# Patient Record
Sex: Male | Born: 1960 | Race: Black or African American | Hispanic: No | Marital: Single | Smoking: Never smoker
Health system: Southern US, Community
[De-identification: ages and names within clinical notes are randomized; demographics above are authoritative.]

## PROBLEM LIST (undated history)

## (undated) DIAGNOSIS — I10 Essential (primary) hypertension: Secondary | ICD-10-CM

## (undated) DIAGNOSIS — E785 Hyperlipidemia, unspecified: Secondary | ICD-10-CM

## (undated) DIAGNOSIS — R079 Chest pain, unspecified: Secondary | ICD-10-CM

## (undated) HISTORY — PX: BUNIONECTOMY: SHX129

## (undated) HISTORY — DX: Chest pain, unspecified: R07.9

---

## 1998-02-04 ENCOUNTER — Encounter: Admission: RE | Admit: 1998-02-04 | Discharge: 1998-02-04 | Payer: Self-pay | Admitting: *Deleted

## 1999-01-27 ENCOUNTER — Ambulatory Visit (HOSPITAL_COMMUNITY): Admission: RE | Admit: 1999-01-27 | Discharge: 1999-01-27 | Payer: Self-pay | Admitting: Gastroenterology

## 1999-09-03 ENCOUNTER — Emergency Department (HOSPITAL_COMMUNITY): Admission: EM | Admit: 1999-09-03 | Discharge: 1999-09-03 | Payer: Self-pay | Admitting: Emergency Medicine

## 2000-01-09 ENCOUNTER — Emergency Department (HOSPITAL_COMMUNITY): Admission: EM | Admit: 2000-01-09 | Discharge: 2000-01-09 | Payer: Self-pay | Admitting: Emergency Medicine

## 2008-08-07 ENCOUNTER — Emergency Department (HOSPITAL_COMMUNITY): Admission: EM | Admit: 2008-08-07 | Discharge: 2008-08-07 | Payer: Self-pay | Admitting: Emergency Medicine

## 2009-07-16 ENCOUNTER — Emergency Department (HOSPITAL_COMMUNITY): Admission: EM | Admit: 2009-07-16 | Discharge: 2009-07-16 | Payer: Self-pay | Admitting: Emergency Medicine

## 2011-01-15 ENCOUNTER — Encounter: Payer: Self-pay | Admitting: Emergency Medicine

## 2011-01-15 ENCOUNTER — Emergency Department (HOSPITAL_COMMUNITY)
Admission: EM | Admit: 2011-01-15 | Discharge: 2011-01-15 | Disposition: A | Discharge status: Home / Self Care | Payer: Self-pay | Attending: Emergency Medicine | Admitting: Emergency Medicine

## 2011-01-15 ENCOUNTER — Emergency Department (HOSPITAL_COMMUNITY): Payer: Self-pay

## 2011-01-15 DIAGNOSIS — R1032 Left lower quadrant pain: Secondary | ICD-10-CM | POA: Insufficient documentation

## 2011-01-15 DIAGNOSIS — Z87891 Personal history of nicotine dependence: Secondary | ICD-10-CM | POA: Insufficient documentation

## 2011-01-15 HISTORY — DX: Hyperlipidemia, unspecified: E78.5

## 2011-01-15 HISTORY — DX: Essential (primary) hypertension: I10

## 2011-01-15 LAB — URINALYSIS, ROUTINE W REFLEX MICROSCOPIC
Bilirubin Urine: NEGATIVE
Ketones, ur: NEGATIVE mg/dL
Leukocytes, UA: NEGATIVE
Protein, ur: NEGATIVE mg/dL
Urobilinogen, UA: 0.2 mg/dL (ref 0.0–1.0)

## 2011-01-15 LAB — DIFFERENTIAL
Eosinophils Relative: 1 % (ref 0–5)
Monocytes Absolute: 0.6 10*3/uL (ref 0.1–1.0)
Neutro Abs: 1.9 10*3/uL (ref 1.7–7.7)
Neutrophils Relative %: 45 % (ref 43–77)

## 2011-01-15 LAB — CBC
Hemoglobin: 12.5 g/dL — ABNORMAL LOW (ref 13.0–17.0)
MCH: 25.1 pg — ABNORMAL LOW (ref 26.0–34.0)
Platelets: 135 10*3/uL — ABNORMAL LOW (ref 150–400)
RBC: 4.98 MIL/uL (ref 4.22–5.81)
RDW: 15.1 % (ref 11.5–15.5)
WBC: 4.3 10*3/uL (ref 4.0–10.5)

## 2011-01-15 LAB — BASIC METABOLIC PANEL
Calcium: 9.6 mg/dL (ref 8.4–10.5)
Chloride: 100 mEq/L (ref 96–112)
GFR calc Af Amer: >60 mL/min (ref >60)
Potassium: 4.1 mEq/L (ref 3.5–5.1)

## 2011-01-15 MED ORDER — IOHEXOL 300 MG/ML  SOLN
100.0000 mL | Freq: Once | INTRAMUSCULAR | Status: AC | PRN
  Administered 2011-01-15: 100 mL via INTRAVENOUS

## 2011-01-15 NOTE — ED Provider Notes (Addendum)
Scribed for Dr. Ethelda Chick, the patient was seen in room 11. This chart was scribed by Jannette Fogo. This patient's care was started at 09:40.    Chief Complaint  Patient presents with  . Abdominal Pain    HPI Isaac Lopez is a 50 y.o. male who presents to the Emergency Department complaining of 5 days of intermittent LLQ abdominal pain. Patient states he developed "sharp" abdominal pain on Tuesday 01/11/11 at 04:00AM which woke him up from sleep. At that time the pain was sharp, a "6"/10 in severity, and he had associated increased urination. Since the pain "comes and goes" but currently it is mild and described as a "pulling" sensation. Reports pain exacerbation with coughing and sneezing but it is not relieved by anything. Patient denies any associated nausea, vomiting, diarrhea, or urinary discomfort. Last bowel movement was yesterday and normal. Patient has a history of diverticulitis "many years ago" but reports current pain feels different. There are no other associated symptoms and no other alleviating or aggravating factors.     Past Medical History  Diagnosis Date  . Hypertension   . Hyperlipemia   Heart murmur diagnosed in 2006.  Diverticulitis "many years ago  Past Surgical History  Procedure Date  . Bunionectomy    MEDICATIONS:  Previous Medications   CYANOCOBALAMIN (VITAMIN B-12) 2500 MCG SUBL    Place 1 tablet under the tongue daily.       ALLERGIES:  Allergies as of 01/15/2011 - Review Complete 01/15/2011  Allergen Reaction Noted  . Other  01/15/2011     Family History  Problem Relation Age of Onset  . Diabetes Mother   . Cancer Father   . Thyroid disease Sister   . Thyroid disease Other     History  Substance Use Topics  . Smoking status: Former Games developer  . Smokeless tobacco: Never Used  . Alcohol Use: No     Review of Systems  Constitutional: Negative.   HENT: Negative.   Respiratory: Negative.   Cardiovascular: Negative.   Gastrointestinal:  Positive for abdominal pain. Negative for nausea, vomiting and diarrhea.  Musculoskeletal: Negative.   Skin: Negative.   Neurological: Negative.   Hematological: Negative.   Psychiatric/Behavioral: Negative.     Physical Exam  BP 126/83  Pulse 69  Temp(Src) 98.1 F (36.7 C) (Oral)  Resp 18  Ht 5\' 10"  (1.778 m)  Wt 218 lb 14.4 oz (99.292 kg)  BMI 31.41 kg/m2  SpO2 94%  Physical Exam  Constitutional: He is oriented to person, place, and time. He appears well-developed and well-nourished. No distress.  HENT:  Head: Normocephalic and atraumatic.  Mouth/Throat: Oropharynx is clear and moist.  Eyes: Conjunctivae are normal. Pupils are equal, round, and reactive to light.  Neck: Neck supple. Carotid bruit is not present. No tracheal deviation (trachea midline) present. No thyromegaly present.  Cardiovascular: Normal rate, regular rhythm and normal heart sounds.   No murmur heard. Pulmonary/Chest: Effort normal and breath sounds normal.  Abdominal: Bowel sounds are normal. He exhibits no distension. There is tenderness (LLQ). There is no guarding.  Genitourinary: Penis normal.       Genitalia normal male  Musculoskeletal: Normal range of motion. He exhibits no edema and no tenderness.  Neurological: He is alert and oriented to person, place, and time. Coordination normal.  Skin: Skin is warm and dry.  Psychiatric: He has a normal mood and affect.    OTHER DATA REVIEWED: Nursing notes, vital signs, and past medical records reviewed.   DIAGNOSTIC  STUDIES: Oxygen Saturation is 94% on room air, normal by my interpretation.     LABS / RADIOLOGY:  Results for orders placed during the hospital encounter of 01/15/11  BASIC METABOLIC PANEL      Component Value Range   Sodium 138  135 - 145 (mEq/L)   Potassium 4.1  3.5 - 5.1 (mEq/L)   Chloride 100  96 - 112 (mEq/L)   CO2 29  19 - 32 (mEq/L)   Glucose, Bld 98  70 - 99 (mg/dL)   BUN 12  6 - 23 (mg/dL)   Creatinine, Ser 1.61  0.50  - 1.35 (mg/dL)   Calcium 9.6  8.4 - 09.6 (mg/dL)   GFR calc non Af Amer >60  >60 (mL/min)   GFR calc Af Amer >60  >60 (mL/min)  URINALYSIS, ROUTINE W REFLEX MICROSCOPIC      Component Value Range   Color, Urine YELLOW  YELLOW    Appearance CLEAR  CLEAR    Specific Gravity, Urine 1.010  1.005 - 1.030    pH 6.0  5.0 - 8.0    Glucose, UA NEGATIVE  NEGATIVE (mg/dL)   Hgb urine dipstick NEGATIVE  NEGATIVE    Bilirubin Urine NEGATIVE  NEGATIVE    Ketones, ur NEGATIVE  NEGATIVE (mg/dL)   Protein, ur NEGATIVE  NEGATIVE (mg/dL)   Urobilinogen, UA 0.2  0.0 - 1.0 (mg/dL)   Nitrite NEGATIVE  NEGATIVE    Leukocytes, UA NEGATIVE  NEGATIVE   CBC      Component Value Range   WBC 4.3  4.0 - 10.5 (K/uL)   RBC 4.98  4.22 - 5.81 (MIL/uL)   Hemoglobin 12.5 (*) 13.0 - 17.0 (g/dL)   HCT 04.5 (*) 40.9 - 52.0 (%)   MCV 77.9 (*) 78.0 - 100.0 (fL)   MCH 25.1 (*) 26.0 - 34.0 (pg)   MCHC 32.2  30.0 - 36.0 (g/dL)   RDW 81.1  91.4 - 78.2 (%)   Platelets 135 (*) 150 - 400 (K/uL)  DIFFERENTIAL      Component Value Range   Neutrophils Relative 45  43 - 77 (%)   Neutro Abs 1.9  1.7 - 7.7 (K/uL)   Lymphocytes Relative 41  12 - 46 (%)   Lymphs Abs 1.8  0.7 - 4.0 (K/uL)   Monocytes Relative 13 (*) 3 - 12 (%)   Monocytes Absolute 0.6  0.1 - 1.0 (K/uL)   Eosinophils Relative 1  0 - 5 (%)   Eosinophils Absolute 0.0  0.0 - 0.7 (K/uL)   Basophils Relative 0  0 - 1 (%)   Basophils Absolute 0.0  0.0 - 0.1 (K/uL)    CT Abdomen / Pelvis: Interpreted by Radiologist Dr.TAYLOR H. STROUD 1. No acute findings identified within the abdomen or pelvis. 2. No evidence for diverticulitis   ED COURSE / COORDINATION OF CARE: 14:00 - Re-examined by ED physician, patient states his abdomen is slight sore but he is comfortable. He declines pain medication at this time.    MDM: Pain felt to be nonspecific. No signs of peritonitis   IMPRESSION: 1. Nonspecific abdominal pain   PLAN: Discharge  Use Tylenol for pain and  follow up with PCP as needed.    CONDITION ON DISCHARGE: Improved and stable.    MEDICATIONS GIVEN IN THE E.D.  Medications  Cyanocobalamin (VITAMIN B-12) 2500 MCG SUBL (not administered)  iohexol (OMNIPAQUE) 300 MG/ML injection 100 mL (100 mL Intravenous Contrast Given 01/15/11 1209)     DISCHARGE MEDICATIONS:  New Prescriptions   No medications on file    Procedures    I personally performed the services described in this documentation, which was scribed in my presence. The recorded information has been reviewed and considered. No att. providers found     Doug Sou, MD 01/15/11 1407  Doug Sou, MD 01/15/11 1407  Doug Sou, MD 01/15/11 1409  Doug Sou, MD 02/12/11 2130  Doug Sou, MD 02/12/11 [id number]  Doug Sou, MD 02/27/11 2029

## 2011-01-15 NOTE — ED Notes (Signed)
Pt c/o L sided abd pain. More frequent urination during Tuesday night.

## 2013-12-12 ENCOUNTER — Emergency Department (HOSPITAL_COMMUNITY)
Admission: EM | Admit: 2013-12-12 | Discharge: 2013-12-12 | Disposition: A | Discharge status: Home / Self Care | Payer: 59 | Attending: Emergency Medicine | Admitting: Emergency Medicine

## 2013-12-12 ENCOUNTER — Emergency Department (HOSPITAL_COMMUNITY): Payer: 59

## 2013-12-12 ENCOUNTER — Encounter (HOSPITAL_COMMUNITY): Payer: Self-pay | Admitting: Emergency Medicine

## 2013-12-12 DIAGNOSIS — R109 Unspecified abdominal pain: Secondary | ICD-10-CM | POA: Insufficient documentation

## 2013-12-12 DIAGNOSIS — K6289 Other specified diseases of anus and rectum: Secondary | ICD-10-CM | POA: Insufficient documentation

## 2013-12-12 DIAGNOSIS — K644 Residual hemorrhoidal skin tags: Secondary | ICD-10-CM | POA: Insufficient documentation

## 2013-12-12 DIAGNOSIS — Z87891 Personal history of nicotine dependence: Secondary | ICD-10-CM | POA: Insufficient documentation

## 2013-12-12 DIAGNOSIS — I10 Essential (primary) hypertension: Secondary | ICD-10-CM | POA: Insufficient documentation

## 2013-12-12 LAB — URINALYSIS, ROUTINE W REFLEX MICROSCOPIC
BILIRUBIN URINE: NEGATIVE
GLUCOSE, UA: NEGATIVE mg/dL
HGB URINE DIPSTICK: NEGATIVE
KETONES UR: NEGATIVE mg/dL
Leukocytes, UA: NEGATIVE
Nitrite: NEGATIVE
PH: 7 (ref 5.0–8.0)
PROTEIN: NEGATIVE mg/dL
Specific Gravity, Urine: 1.009 (ref 1.005–1.030)
UROBILINOGEN UA: 1 mg/dL (ref 0.0–1.0)

## 2013-12-12 LAB — CBC WITH DIFFERENTIAL/PLATELET
BASOS PCT: 0 % (ref 0–1)
Basophils Absolute: 0 10*3/uL (ref 0.0–0.1)
Eosinophils Absolute: 0 10*3/uL (ref 0.0–0.7)
Eosinophils Relative: 1 % (ref 0–5)
HEMATOCRIT: 37.8 % — AB (ref 39.0–52.0)
Hemoglobin: 12 g/dL — ABNORMAL LOW (ref 13.0–17.0)
LYMPHS PCT: 40 % (ref 12–46)
Lymphs Abs: 1.7 10*3/uL (ref 0.7–4.0)
MCH: 25.1 pg — AB (ref 26.0–34.0)
MCHC: 31.7 g/dL (ref 30.0–36.0)
MCV: 78.9 fL (ref 78.0–100.0)
MONO ABS: 0.5 10*3/uL (ref 0.1–1.0)
Monocytes Relative: 12 % (ref 3–12)
NEUTROS ABS: 2 10*3/uL (ref 1.7–7.7)
NEUTROS PCT: 47 % (ref 43–77)
Platelets: 133 10*3/uL — ABNORMAL LOW (ref 150–400)
RBC: 4.79 MIL/uL (ref 4.22–5.81)
RDW: 14 % (ref 11.5–15.5)
WBC: 4.3 10*3/uL (ref 4.0–10.5)

## 2013-12-12 LAB — COMPREHENSIVE METABOLIC PANEL
ALK PHOS: 113 U/L (ref 39–117)
ALT: 14 U/L (ref 0–53)
AST: 21 U/L (ref 0–37)
Albumin: 3.8 g/dL (ref 3.5–5.2)
BILIRUBIN TOTAL: 0.5 mg/dL (ref 0.3–1.2)
BUN: 11 mg/dL (ref 6–23)
CO2: 27 meq/L (ref 19–32)
Calcium: 9.1 mg/dL (ref 8.4–10.5)
Chloride: 101 mEq/L (ref 96–112)
Creatinine, Ser: 1.29 mg/dL (ref 0.50–1.35)
GFR, EST AFRICAN AMERICAN: 71 mL/min — AB (ref >90)
GFR, EST NON AFRICAN AMERICAN: 61 mL/min — AB (ref >90)
GLUCOSE: 87 mg/dL (ref 70–99)
POTASSIUM: 4.3 meq/L (ref 3.7–5.3)
Sodium: 138 mEq/L (ref 137–147)
Total Protein: 7.2 g/dL (ref 6.0–8.3)

## 2013-12-12 LAB — LIPASE, BLOOD: LIPASE: 28 U/L (ref 11–59)

## 2013-12-12 MED ORDER — SODIUM CHLORIDE 0.9 % IV BOLUS (SEPSIS)
1000.0000 mL | Freq: Once | INTRAVENOUS | Status: AC
  Administered 2013-12-12: 1000 mL via INTRAVENOUS

## 2013-12-12 MED ORDER — TRAMADOL HCL 50 MG PO TABS: 50.0000 mg | ORAL_TABLET | Freq: Four times a day (QID) | ORAL | Status: DC | PRN

## 2013-12-12 MED ORDER — IOHEXOL 300 MG/ML  SOLN
50.0000 mL | Freq: Once | INTRAMUSCULAR | Status: AC | PRN
  Administered 2013-12-12: 50 mL via ORAL

## 2013-12-12 MED ORDER — IOHEXOL 300 MG/ML  SOLN
100.0000 mL | Freq: Once | INTRAMUSCULAR | Status: AC | PRN
  Administered 2013-12-12: 100 mL via INTRAVENOUS

## 2013-12-12 NOTE — ED Provider Notes (Signed)
CSN: [id number]     Arrival date & time 12/12/13  1203 History   First MD Initiated Contact with Patient 12/12/13 1306     Chief Complaint  Patient presents with  . Abdominal Pain     (Consider location/radiation/quality/duration/timing/severity/associated sxs/prior Treatment) The history is provided by the patient.  Isaac Lopez is a 53 y.o. male hx of HTN, HL here with lower abdominal pain. Lower abdominal pain intermittently for the last week. It was sharp and feeling like spasms with radiation to the back. States that since yesterday it's been more in the right lower quadrant. Denies any vomiting. Has some urinary frequency but no dysuria. Also has history hemorrhoids and has some pain in the rectal area. Denies any bleeding from rectum.    Past Medical History  Diagnosis Date  . Hypertension   . Hyperlipemia    Past Surgical History  Procedure Laterality Date  . Bunionectomy     Family History  Problem Relation Age of Onset  . Diabetes Mother   . Cancer Father   . Thyroid disease Sister   . Thyroid disease Other    History  Substance Use Topics  . Smoking status: Former Games developermoker  . Smokeless tobacco: Never Used  . Alcohol Use: No    Review of Systems  Gastrointestinal: Positive for abdominal pain and rectal pain.  All other systems reviewed and are negative.     Allergies  Other  Home Medications   Prior to Admission medications   Not on File   BP 131/95  Pulse 54  Temp(Src) 97.7 F (36.5 C) (Oral)  Resp 19  SpO2 95% Physical Exam  Nursing note and vitals reviewed. Constitutional: He is oriented to person, place, and time. He appears well-nourished.  Uncomfortable   HENT:  Head: Normocephalic.  Mouth/Throat: Oropharynx is clear and moist.  Eyes: Conjunctivae are normal. Pupils are equal, round, and reactive to light.  Neck: Normal range of motion. Neck supple.  Cardiovascular: Normal rate, regular rhythm and normal heart sounds.   Pulmonary/Chest:  Effort normal and breath sounds normal. No respiratory distress. He has no wheezes. He has no rales.  Abdominal: Soft.  + RLQ tenderness, no rebound   Genitourinary:  Small external hemorrhoid, not bleeding and not thrombosed   Musculoskeletal: Normal range of motion. He exhibits no edema and no tenderness.  Neurological: He is alert and oriented to person, place, and time. No cranial nerve deficit. Coordination normal.  Skin: Skin is warm and dry.  Psychiatric: He has a normal mood and affect. His behavior is normal. Judgment and thought content normal.    ED Course  Procedures (including critical care time) Labs Review Labs Reviewed  CBC WITH DIFFERENTIAL - Abnormal; Notable for the following:    Hemoglobin 12.0 (*)    HCT 37.8 (*)    MCH 25.1 (*)    Platelets 133 (*)    All other components within normal limits  COMPREHENSIVE METABOLIC PANEL - Abnormal; Notable for the following:    GFR calc non Af Amer 61 (*)    GFR calc Af Amer 71 (*)    All other components within normal limits  LIPASE, BLOOD  URINALYSIS, ROUTINE W REFLEX MICROSCOPIC    Imaging Review Ct Abdomen Pelvis W Contrast  12/12/2013   CLINICAL DATA:  Right lower quadrant pain  EXAM: CT ABDOMEN AND PELVIS WITH CONTRAST  TECHNIQUE: Multidetector CT imaging of the abdomen and pelvis was performed using the standard protocol following bolus administration of intravenous  contrast.  CONTRAST:  50mL OMNIPAQUE IOHEXOL 300 MG/ML SOLN, 100mL OMNIPAQUE IOHEXOL 300 MG/ML SOLN  COMPARISON:  01/15/2011  FINDINGS: The lung bases are clear.  The liver demonstrates no focal abnormality. There is no intrahepatic or extrahepatic biliary ductal dilatation. The gallbladder is normal. The spleen demonstrates no focal abnormality.There is a 3.5 cm hypodense, fluid attenuating right renal mass most consistent with a cyst. The left kidney, adrenal glands and pancreas are normal. The bladder is unremarkable.  The stomach, duodenum, small  intestine, and large intestine demonstrate no contrast extravasation or dilatation. There is a normal caliber appendix in the right lower quadrant without periappendiceal inflammatory changes. There is no pneumoperitoneum, pneumatosis, or portal venous gas. There is no abdominal or pelvic free fluid. Just posterior to the aortic bifurcation there is a stable 11 mm lymph node.  The abdominal aorta is normal in caliber.  There are no lytic or sclerotic osseous lesions.  IMPRESSION: No acute abdominal or pelvic pathology.   Electronically Signed   By: Elige KoHetal  Patel   On: 12/12/2013 15:05     EKG Interpretation None      MDM   Final diagnoses:  None    Isaac Lopez is a 53 y.o. male here with abdominal pain. Given RLQ tenderness will get CT to r/o appy. CT showed no appy, labs and UA unremarkable. Likely gastro. Will d/c home.      Richardean Canalavid H Yao, MD 12/12/13 [phone]

## 2013-12-12 NOTE — ED Notes (Addendum)
Initial contact-C/o right lower abdominal pain radiating to the back. Denies vomiting or diarrhea. Denies dizziness or chest pain. Diagnosed with diverticulitis "many years ago." No precipitating factors. Nothing makes pain better or worse. Still has regular BM but does have "soreness" and has had hemorrhoids in the past. Did not take any medication today. In NAD. Moving all extremities. No other complaints at this time.

## 2013-12-12 NOTE — Discharge Instructions (Signed)
Take tramadol as needed for cramping.   Stay hydrated.   Use preparation H twice a day for hemorrhoids and use sitz bath three times a day.   Follow up with your doctor.   Return to [facility] if you have severe pain, vomiting, dehydration.

## 2013-12-12 NOTE — ED Notes (Signed)
Pt alert, arrives from home, c/o right lower abd pain, onset was a week ago, describes pain as sharp, "spasm like", radiates to back, pain is intermittent, denies changes in bladder, states "spasm in rectal area"

## 2013-12-12 NOTE — ED Notes (Signed)
Report given to Chere, RN.

## 2013-12-16 ENCOUNTER — Emergency Department (HOSPITAL_COMMUNITY): Payer: 59

## 2013-12-16 ENCOUNTER — Encounter (HOSPITAL_COMMUNITY): Payer: Self-pay | Admitting: Emergency Medicine

## 2013-12-16 ENCOUNTER — Emergency Department (HOSPITAL_COMMUNITY)
Admission: EM | Admit: 2013-12-16 | Discharge: 2013-12-16 | Disposition: A | Discharge status: Home / Self Care | Payer: 59 | Attending: Emergency Medicine | Admitting: Emergency Medicine

## 2013-12-16 DIAGNOSIS — Z87891 Personal history of nicotine dependence: Secondary | ICD-10-CM | POA: Insufficient documentation

## 2013-12-16 DIAGNOSIS — R072 Precordial pain: Secondary | ICD-10-CM

## 2013-12-16 DIAGNOSIS — Z79899 Other long term (current) drug therapy: Secondary | ICD-10-CM | POA: Insufficient documentation

## 2013-12-16 DIAGNOSIS — Z862 Personal history of diseases of the blood and blood-forming organs and certain disorders involving the immune mechanism: Secondary | ICD-10-CM | POA: Insufficient documentation

## 2013-12-16 DIAGNOSIS — I10 Essential (primary) hypertension: Secondary | ICD-10-CM | POA: Insufficient documentation

## 2013-12-16 DIAGNOSIS — R0789 Other chest pain: Secondary | ICD-10-CM

## 2013-12-16 DIAGNOSIS — Z8639 Personal history of other endocrine, nutritional and metabolic disease: Secondary | ICD-10-CM | POA: Insufficient documentation

## 2013-12-16 DIAGNOSIS — I319 Disease of pericardium, unspecified: Secondary | ICD-10-CM

## 2013-12-16 LAB — TROPONIN I: Troponin I: <0.3 ng/mL (ref <0.30)

## 2013-12-16 LAB — I-STAT TROPONIN, ED: Troponin i, poc: 0.02 ng/mL (ref 0.00–0.08)

## 2013-12-16 LAB — HEPATIC FUNCTION PANEL
ALBUMIN: 4 g/dL (ref 3.5–5.2)
ALT: 12 U/L (ref 0–53)
AST: 22 U/L (ref 0–37)
Alkaline Phosphatase: 110 U/L (ref 39–117)
BILIRUBIN TOTAL: 0.3 mg/dL (ref 0.3–1.2)
Bilirubin, Direct: <0.2 mg/dL (ref 0.0–0.3)
Total Protein: 7.6 g/dL (ref 6.0–8.3)

## 2013-12-16 LAB — BASIC METABOLIC PANEL
BUN: 12 mg/dL (ref 6–23)
CHLORIDE: 103 meq/L (ref 96–112)
CO2: 25 meq/L (ref 19–32)
CREATININE: 1.3 mg/dL (ref 0.50–1.35)
Calcium: 9.4 mg/dL (ref 8.4–10.5)
GFR calc Af Amer: 70 mL/min — ABNORMAL LOW (ref >90)
GFR, EST NON AFRICAN AMERICAN: 61 mL/min — AB (ref >90)
GLUCOSE: 91 mg/dL (ref 70–99)
Potassium: 4.6 mEq/L (ref 3.7–5.3)
SODIUM: 139 meq/L (ref 137–147)

## 2013-12-16 LAB — CBC
HCT: 40.8 % (ref 39.0–52.0)
Hemoglobin: 13.3 g/dL (ref 13.0–17.0)
MCH: 25.6 pg — ABNORMAL LOW (ref 26.0–34.0)
MCHC: 32.6 g/dL (ref 30.0–36.0)
MCV: 78.5 fL (ref 78.0–100.0)
PLATELETS: 135 10*3/uL — AB (ref 150–400)
RBC: 5.2 MIL/uL (ref 4.22–5.81)
RDW: 13.9 % (ref 11.5–15.5)
WBC: 3 10*3/uL — AB (ref 4.0–10.5)

## 2013-12-16 LAB — PROTIME-INR
INR: 1.12 (ref 0.00–1.49)
Prothrombin Time: 14.4 seconds (ref 11.6–15.2)

## 2013-12-16 LAB — D-DIMER, QUANTITATIVE (NOT AT ARMC): D DIMER QUANT: 0.38 ug{FEU}/mL (ref 0.00–0.48)

## 2013-12-16 LAB — LIPASE, BLOOD: Lipase: 24 U/L (ref 11–59)

## 2013-12-16 MED ORDER — OMEPRAZOLE 20 MG PO CPDR: 20.0000 mg | DELAYED_RELEASE_CAPSULE | Freq: Every day | ORAL | Status: DC

## 2013-12-16 MED ORDER — ASPIRIN 81 MG PO CHEW
324.0000 mg | CHEWABLE_TABLET | Freq: Once | ORAL | Status: AC
  Administered 2013-12-16: 324 mg via ORAL
  Filled 2013-12-16: qty 4

## 2013-12-16 MED ORDER — IBUPROFEN 600 MG PO TABS: 600.0000 mg | ORAL_TABLET | Freq: Four times a day (QID) | ORAL | Status: DC | PRN

## 2013-12-16 MED ORDER — IBUPROFEN 800 MG PO TABS
800.0000 mg | ORAL_TABLET | Freq: Once | ORAL | Status: AC
  Administered 2013-12-16: 800 mg via ORAL
  Filled 2013-12-16: qty 1

## 2013-12-16 NOTE — ED Provider Notes (Signed)
CSN: [id number]     Arrival date & time 12/16/13  [id number] History   First MD Initiated Contact with Patient 12/16/13 1010     Chief Complaint  Patient presents with  . Chest Pain     (Consider location/radiation/quality/duration/timing/severity/associated sxs/prior Treatment) HPI Comments: Patient presents with right-sided chest pain it radiates to his right arm started yesterday afternoon has been constant. Nothing makes it better or worse. It radiates to his arm and his back. Denies any cardiac history. Denies any nausea, vomiting, abdominal pain, fever or cough. No previous cardiac history. He has never had a stress test. He is a smoker. Admits to a history of hypertension and hyperlipidemia. EKG shows ST elevation in V2 and V3 which is less than 1 mm. Discussed on arrival with cardiology.  The history is provided by the patient and a relative.    Past Medical History  Diagnosis Date  . Hypertension   . Hyperlipemia    Past Surgical History  Procedure Laterality Date  . Bunionectomy     Family History  Problem Relation Age of Onset  . Diabetes Mother   . Cancer Father   . Thyroid disease Sister   . Thyroid disease Other    History  Substance Use Topics  . Smoking status: Former Games developermoker  . Smokeless tobacco: Never Used  . Alcohol Use: No    Review of Systems  Constitutional: Negative for fever and activity change.  Respiratory: Positive for chest tightness. Negative for cough and shortness of breath.   Cardiovascular: Positive for chest pain.  Gastrointestinal: Negative for nausea, vomiting and abdominal pain.  Genitourinary: Negative for dysuria and hematuria.  Musculoskeletal: Negative for arthralgias, back pain and myalgias.  Skin: Negative for rash.  Neurological: Negative for dizziness, weakness and headaches.  A complete 10 system review of systems was obtained and all systems are negative except as noted in the HPI and PMH.      Allergies  Other  Home  Medications   Prior to Admission medications   Medication Sig Start Date End Date Taking? Authorizing Provider  ibuprofen (ADVIL,MOTRIN) 200 MG tablet Take 200 mg by mouth every 6 (six) hours as needed for moderate pain (chest pain).   Yes Historical Provider, MD  traMADol (ULTRAM) 50 MG tablet Take 1 tablet (50 mg total) by mouth every 6 (six) hours as needed. 12/12/13  Yes Richardean Canalavid H Yao, MD  ibuprofen (ADVIL,MOTRIN) 600 MG tablet Take 1 tablet (600 mg total) by mouth every 6 (six) hours as needed. 12/16/13   Glynn OctaveStephen Rancour, MD  omeprazole (PRILOSEC) 20 MG capsule Take 1 capsule (20 mg total) by mouth daily. 12/16/13   Glynn OctaveStephen Rancour, MD   BP 139/92  Pulse 56  Temp(Src) 97.8 F (36.6 C) (Oral)  Resp 16  SpO2 93% Physical Exam  Nursing note and vitals reviewed. Constitutional: He is oriented to person, place, and time. He appears well-developed and well-nourished. No distress.  HENT:  Head: Normocephalic and atraumatic.  Mouth/Throat: Oropharynx is clear and moist. No oropharyngeal exudate.  Eyes: Conjunctivae and EOM are normal. Pupils are equal, round, and reactive to light.  Neck: Normal range of motion. Neck supple.  No meningismus.  Cardiovascular: Normal rate, regular rhythm, normal heart sounds and intact distal pulses.   No murmur heard. Pulmonary/Chest: Effort normal and breath sounds normal. No respiratory distress.  Abdominal: Soft. There is no tenderness. There is no rebound and no guarding.  Musculoskeletal: Normal range of motion. He exhibits no edema and no  tenderness.  Neurological: He is alert and oriented to person, place, and time. No cranial nerve deficit. He exhibits normal muscle tone. Coordination normal.  No ataxia on finger to nose bilaterally. No pronator drift. 5/5 strength throughout. CN 2-12 intact. Negative Romberg. Equal grip strength. Sensation intact. Gait is normal.   Skin: Skin is warm.  Psychiatric: He has a normal mood and affect. His behavior is  normal.    ED Course  Procedures (including critical care time) Labs Review Labs Reviewed  CBC - Abnormal; Notable for the following:    WBC 3.0 (*)    MCH 25.6 (*)    Platelets 135 (*)    All other components within normal limits  BASIC METABOLIC PANEL - Abnormal; Notable for the following:    GFR calc non Af Amer 61 (*)    GFR calc Af Amer 70 (*)    All other components within normal limits  PROTIME-INR  HEPATIC FUNCTION PANEL  LIPASE, BLOOD  D-DIMER, QUANTITATIVE  TROPONIN I  TROPONIN I  TROPONIN I  TROPONIN I  I-STAT TROPOININ, ED    Imaging Review Dg Chest Portable 1 View  12/16/2013   CLINICAL DATA:  Chest pain.  EXAM: PORTABLE CHEST - 1 VIEW  COMPARISON:  PA and lateral chest 07/16/2009.  FINDINGS: Mild linear atelectasis is seen in the lung bases. The lungs are otherwise clear. Heart size is normal. There is no pneumothorax or pleural effusion.  IMPRESSION: No acute disease.   Electronically Signed   By: Drusilla Kanner M.D.   On: 12/16/2013 11:07     EKG Interpretation   Date/Time:  Monday December 16 2013 14:05:23 EDT Ventricular Rate:  68 PR Interval:  181 QRS Duration: 91 QT Interval:  446 QTC Calculation: 474 R Axis:   30 Text Interpretation:  Sinus rhythm RSR' in V1 or V2, right VCD or RVH ST  elevation, consider inferior injury diffuse ST elevation No significant  change was found Confirmed by Manus Gunning  MD, STEPHEN [phone]) on 12/16/2013  2:29:56 PM      MDM   Final diagnoses:  Pericarditis  Atypical chest pain   right-sided chest pain radiating to the right arm it has been constant since yesterday. No shortness of breath, nausea, vomiting or diaphoresis. Equal peripheral pulses, equal grip strength.  EKG on arrival discussed with Dr. Clifton James who does not feel it represents STEMI. He recommends troponin. And he will send someone from cardiology to evaluate.  Troponin negative. D-dimer negative. Doubt PE or aortic dissection.  Dr Patty Sermons has seen  patient and ordered echocardiogram. He is concerned for pericarditis.  Echocardiogram is normal. Left ventricle: The cavity size was normal. Wall thickness was normal. Systolic function was normal. The estimated ejection fraction was in the range of 50% to 55%. Wall motion was normal; there were no regional wall motion abnormalities. Left ventricular diastolic function parameters were normal. - Right atrium: The atrium was mildly dilated. - Atrial septum: No defect or patent foramen ovale was identified.  No pericardial effusion. Pain has improved in the ED.  Repeat EKG with similar diffuse ST elevation.  Troponin negative x2. As per Dr. Patty Sermons, patient offered observation overnight but he prefers to go home. NSAIDs and PPI will be started.  He needs cardiology followup in 1 week.  He understands to return to the ED for new or worsening symptoms.  BP 139/92  Pulse 56  Temp(Src) 97.8 F (36.6 C) (Oral)  Resp 16  SpO2 93%   Jeannett Senior  Rancour, MD 12/16/13 [id number]

## 2013-12-16 NOTE — Progress Notes (Signed)
Echo Lab  2D Echocardiogram completed.  Melissa L Morford, RDCS 12/16/2013 12:46 PM

## 2013-12-16 NOTE — ED Notes (Signed)
Bed: RESA Expected date:  Expected time:  Means of arrival:  Comments: From short stay, hypotensive

## 2013-12-16 NOTE — Discharge Instructions (Signed)
Pericarditis Take the medications as prescribed. Follow up with the heart doctor next week. Return to the ED if you develop worsening chest pain, SOB, or any other concerns. Pericarditis is swelling (inflammation) of the pericardium. The pericardium is a thin, double-layered, fluid-filled tissue sac that surrounds the heart. The purpose of the pericardium is to contain the heart in the chest cavity and keep the heart from overexpanding. Different types of pericarditis can occur, such as:  Acute pericarditis. Inflammation can develop suddenly in acute pericarditis.  Chronic pericarditis. Inflammation develops gradually and is long-lasting in chronic pericarditis.  Constrictive pericarditis. In this type of pericarditis, the layers of the pericardium stiffen and develop scar tissue. The scar tissue thickens and sticks together. This makes it difficult for the heart to pump and work as it normally does. CAUSES  Pericarditis can be caused from different conditions, such as:  A bacterial, fungal or viral infection.  After a heart attack (myocardial infarction).  After open-heart surgery (coronary bypass graft surgery).  Auto-immune conditions such as lupus, rheumatoid arthritis or scleroderma.  Kidney failure.  Low thyroid condition (hypothyroidism).  Cancer from another part of the body that has spread (metastasized) to the pericardium.  Chest injury or trauma.  After radiation treatment.  Certain medicines. SYMPTOMS  Symptoms of pericarditis can include:  Chest pain. Chest pain symptoms may increase when laying down and may be relieved when sitting up and leaning forward.  A chronic, dry cough.  Heart palpitations. These may feel like rapid, fluttering or pounding heart beats.  Chest pain may be worse when swallowing.  Dizziness or fainting.  Tiredness, fatigue or lethargy.  Fever. DIAGNOSIS  Pericarditis is diagnosed by the following:  A physical exam. A heart sound  called a pericardial friction rub may be heard when your caregiver listens to your heart.  Blood work. Blood may be drawn to check for an infection and to look at your blood chemistry.  Electrocardiography. During electrocardiography your heart's electrical activity is monitored and recorded with a tracing on paper (electrocardiogram [ECG]).  Echocardiography.  Computed tomography (CT).  Magnetic resonance image (MRI). TREATMENT  To treat pericarditis, it is important to know the cause of it. The cause of pericarditis determines the treatment.   If the cause of pericarditis is due to an infection, treatment is based on the type of infection. If an infection is suspected in the pericardial fluid, a procedure called a pericardial fluid culture and biopsy may be done. This takes a sample of the pericardial fluid. The sample is sent to a lab which runs tests on the pericardial fluid to check for an infection.  If the autoimmune disease is the cause, treatment of the autoimmune condition will help improve the pericarditis.  If the cause of pericarditis is not known, anti-inflammatory medicines may be used to help decrease the inflammation.  Surgery may be needed. The following are types of surgeries or procedures that may be done to treat pericarditis:  Pericardial window. A pericardial window makes a cut (incision) into the pericardial sac. This allows excess fluid in the pericardium to drain.  Pericardiocentesis. A pericardiocentesis is also known as a pericardial tap. This procedure uses a needle that is guided by X-ray to drain (aspirate) excess fluid from the pericardium.  Pericardiectomy. A pericardiectomy removes part or all of the pericardium. HOME CARE INSTRUCTIONS   Do not smoke. If you smoke, quit. Your caregiver can help you quit smoking.  Maintain a healthy weight.  Follow an exercise program  as told by your caregiver.  If you drink alcohol, do so in moderation.  Eat a  heart healthy diet. A registered dietician can help you learn about healthy food choices.  Keep a list of all your medicines with you at all times. Include the name, dose, how often it is taken and how it is taken. SEEK IMMEDIATE MEDICAL CARE IF:   You have chest pain or feelings of chest pressure.  You have sweating (diaphoresis) when at rest.  You have irregular heartbeats (palpitations).  You have rapid, racing heart beats.  You have unexplained fainting episodes.  You feel sick to your stomach (nausea) or vomiting without cause.  You have unexplained weakness. If you develop any of the symptoms which originally made you seek care, call for local emergency medical help. Do not drive yourself to the hospital. Document Released: 11/30/2000 Document Revised: 08/29/2011 Document Reviewed: 06/08/2011 [facility] Patient Information 2015 [employer]. This information is not intended to replace advice given to you by your health care provider. Make sure you discuss any questions you have with your health care provider.

## 2013-12-16 NOTE — Consult Note (Signed)
CARDIOLOGY CONSULT NOTE   Patient ID: Marilu FavreMark Gemma MRN: [id number], DOB/AGE: 1960/10/09   Admit date: 12/16/2013 Date of Consult: 12/16/2013   Primary Physician: No primary provider on file. Primary Cardiologist: None  Pt. Profile  Previously healthy 53 year old gentleman on no cardiac medications who presents with chest pain.  Problem List  Past Medical History  Diagnosis Date  . Hypertension   . Hyperlipemia     Past Surgical History  Procedure Laterality Date  . Bunionectomy       Allergies  Allergies  Allergen Reactions  . Other     Dust mites, ragweed, cigarette smoke, cockroach    HPI   This pleasant 53 year old gentleman has been in good general health.  About 4 years ago he was told that he had a slight heart murmur.  He has had a history of hyperlipidemia in the past but has not been on any medication.  There is a question of borderline hypertension in the past again not requiring any medication.  The patient was in his usual state of health yesterday morning sitting in church when he noted the onset of a sharp precordial chest discomfort worse with movement or taking a deep breath.  It was not associated with any shortness of breath or diaphoresis.  There was no arm radiation but there has been radiation to the right scapular area.  Inpatient Medications    Family History Family History  Problem Relation Age of Onset  . Diabetes Mother   . Cancer Father   . Thyroid disease Sister   . Thyroid disease Other    the family history is negative for premature coronary artery disease Social History History   Social History  . Marital Status: Single    Spouse Name: N/A    Number of Children: N/A  . Years of Education: N/A   Occupational History  . Not on file.   Social History Main Topics  . Smoking status: Former Games developermoker  . Smokeless tobacco: Never Used  . Alcohol Use: No  . Drug Use: No  . Sexual Activity: Yes    Birth Control/ Protection:  Condom   Other Topics Concern  . Not on file   Social History Narrative  . No narrative on file    The patient works in Therapist, sportsproperty management.  Review of Systems  General:  No chills, fever, night sweats or weight changes.  Cardiovascular:  No chest pain, dyspnea on exertion, edema, orthopnea, palpitations, paroxysmal nocturnal dyspnea. Dermatological: No rash, lesions/masses Respiratory: No cough, dyspnea Urologic: No hematuria, dysuria Abdominal:   No nausea, vomiting, diarrhea, bright red blood per rectum, melena, or hematemesis Neurologic:  No visual changes, wkns, changes in mental status. All other systems reviewed and are otherwise negative except as noted above.  Physical Exam  Blood pressure 135/93, pulse 60, temperature 97.8 F (36.6 C), temperature source Oral, resp. rate 0, SpO2 96.00%.  General: Pleasant, NAD Psych: Normal affect. Neuro: Alert and oriented X 3. Moves all extremities spontaneously. HEENT: Normal  Neck: Supple without bruits or JVD. Lungs:  Resp regular and unlabored, CTA. Heart: RRR.  There is a soft systolic ejection murmur at the base.  I do not hear any pericardial rub or gallop. Abdomen: Soft, non-tender, non-distended, BS + x 4.  Extremities: No clubbing, cyanosis or edema. DP/PT/Radials 2+ and equal bilaterally.  Labs  No results found for this basename: CKTOTAL, CKMB, TROPONINI,  in the last 72 hours Lab Results  Component Value Date   WBC  3.0* 12/16/2013   HGB 13.3 12/16/2013   HCT 40.8 12/16/2013   MCV 78.5 12/16/2013   PLT 135* 12/16/2013     Recent Labs Lab 12/16/13 1019  NA 139  K 4.6  CL 103  CO2 25  BUN 12  CREATININE 1.30  CALCIUM 9.4  PROT 7.6  BILITOT 0.3  ALKPHOS 110  ALT 12  AST 22  GLUCOSE 91   No results found for this basename: CHOL,  HDL,  LDLCALC,  TRIG   No results found for this basename: DDIMER    Radiology/Studies  Ct Abdomen Pelvis W Contrast  12/12/2013   CLINICAL DATA:  Right lower quadrant  pain  EXAM: CT ABDOMEN AND PELVIS WITH CONTRAST  TECHNIQUE: Multidetector CT imaging of the abdomen and pelvis was performed using the standard protocol following bolus administration of intravenous contrast.  CONTRAST:  50mL OMNIPAQUE IOHEXOL 300 MG/ML SOLN, OMNIPAQUE IOHEXOL 300 MG/ML SOLN  COMPARISON:  01/15/2011  FINDINGS: The lung bases are clear.  The liver demonstrates no focal abnormality. There is no intrahepatic or extrahepatic biliary ductal dilatation. The gallbladder is normal. The spleen demonstrates no focal abnormality.There is a 3.5 cm hypodense, fluid attenuating right renal mass most consistent with a cyst. The left kidney, adrenal glands and pancreas are normal. The bladder is unremarkable.  The stomach, duodenum, small intestine, and large intestine demonstrate no contrast extravasation or dilatation. There is a normal caliber appendix in the right lower quadrant without periappendiceal inflammatory changes. There is no pneumoperitoneum, pneumatosis, or portal venous gas. There is no abdominal or pelvic free fluid. Just posterior to the aortic bifurcation there is a stable 11 mm lymph node.  The abdominal aorta is normal in caliber.  There are no lytic or sclerotic osseous lesions.  IMPRESSION: No acute abdominal or pelvic pathology.   Electronically Signed   By: Elige Ko   On: 12/12/2013 15:05   Dg Chest Portable 1 View  12/16/2013   CLINICAL DATA:  Chest pain.  EXAM: PORTABLE CHEST - 1 VIEW  COMPARISON:  PA and lateral chest 07/16/2009.  FINDINGS: Mild linear atelectasis is seen in the lung bases. The lungs are otherwise clear. Heart size is normal. There is no pneumothorax or pleural effusion.  IMPRESSION: No acute disease.   Electronically Signed   By: Drusilla Kanner M.D.   On: 12/16/2013 11:07    ECG  Normal sinus rhythm.  Slight ST elevation in the epicardial leads suggestive of possible acute pericarditis.  ASSESSMENT AND PLAN 1. chest pain suggestive of possible  mild acute pericarditis 2. history of hypertension 3. history of hypercholesterolemia 4. history of prior heart murmur 5. history of diverticulosis  Recommendation: I have ordered an echocardiogram to look at his pericardium and also to evaluate his heart murmur.  I have ordered serial troponins.  At this point I don't find any evidence of acute coronary syndrome.  I would recheck an EKG this afternoon to be sure there are not any worsening serial changes.  Would consider empiric treatment with nonsteroidal anti-inflammatory regimen such as ibuprofen 600 mg 3 times a day and colchicine 0.6 mg twice a day if the remainder of his workup today shows no evidence of acute ischemic change.  Consider either admission for observation overnight on the medical service or possibly if he appears to be stable he could be considered for discharge home this evening and followup with cardiology in the office in about a week.  Would also consider adding a proton  pump inhibitor while he is on nonsteroidals. Thank you for this interesting consult.   Signed, Cassell Clementhomas Brackbill, MD  12/16/2013, 12:32 PM

## 2013-12-16 NOTE — ED Notes (Signed)
Chest pain radiating through chest and to right arm.  Started yesterday.  No cardiac history.  Pt states pain radiating to arm and back.  No trauma injury.  Pt states no cough/fever/congestion.

## 2014-01-08 ENCOUNTER — Ambulatory Visit (INDEPENDENT_AMBULATORY_CARE_PROVIDER_SITE_OTHER): Payer: 59 | Admitting: Cardiology

## 2014-01-08 VITALS — BP 130/84 | HR 67 | Ht 69.0 in | Wt 205.7 lb

## 2014-01-08 DIAGNOSIS — R079 Chest pain, unspecified: Secondary | ICD-10-CM

## 2014-01-08 NOTE — Patient Instructions (Signed)
Your physician recommends that you schedule a follow-up appointment in: As Needed with Dr Patty SermonsBrackbill

## 2014-01-08 NOTE — Progress Notes (Signed)
Patient ID: Isaac Lopez, male   DOB: 1961/04/20, 53 y.o.   MRN: [id number]    01/08/2014 Isaac FavreMark Horseman   1961/04/20  [id number]  Primary Physicia No PCP Per Patient Primary Cardiologist: Dr. Patty SermonsBrackbill  HPI:  Isaac Lopez presents to clinic today for post hospital followup, after [facility] for chest pain.  He is a 53 year old male, who has been in good health. 4 years ago he was told that he had a slight heart murmur. He has had a history of hyperlipidemia in the past but has not been on any medication. There is a question of borderline hypertension in the past, again not requiring any medication. In the ED, he stated that he was in his usual state of health until the day prior, when he was sitting in church and noted the onset of a sharp precordial chest discomfort, worse with movement and with taking a deep breath. It was not associated with any shortness of breath or diaphoresis. There was no arm radiation but he did note radiation to the right scapular area. He was evaluated in the ED by Dr. Patty SermonsBrackbill. A d-dimer was negative. His EKG demonstrated normal sinus rhythm, however there were slight ST elevations in the epicardial leads, suggestive of possible acute pericarditis. Serial troponins were cycled and were negative x 3. Repeat EKG did not demonstrate any worsening serial changes. A 2-D echocardiogram was ordered to assess his pericardium and to also evaluate his murmur. The pericardium was normal in appearance. There was no pericardial effusion. The aortic, mitral, pulmonic and tricuspid valves were all noted to be structurally normal with normal cusp/leaflet separation. Visualization of the atrial septum revealed no defect or patent foramen ovale. Empiric treatment with a nonsteroidal anti-inflammatory regimen with Ibuprofen was initiated for presumed pericarditis. Colchicine was recommended but was not prescribed by ED MD. He was also placed  on a PPI for GI protection. He was discharged from the emergency department the same day of arrival.  He presents back to clinic today for post hospital follow up. He reports significant improvement in symptoms. No further pleuritic chest pain and no dyspnea. He has resumed regular activity w/o limitation. He states [place] [address] [place] weeks ago and denies any exertional chest pain or dyspnea. He is able to walk a flight of stairs without symptoms.   His EKG today continues to demonstrate mild ST elevation in V2 and V4. He denies chest pain/ dyspnea.   Current Outpatient Prescriptions  Medication Sig Dispense Refill  . ibuprofen (ADVIL,MOTRIN) 200 MG tablet Take 200 mg by mouth every 6 (six) hours as needed for moderate pain (chest pain).      Marland Kitchen. ibuprofen (ADVIL,MOTRIN) 600 MG tablet Take 1 tablet (600 mg total) by mouth every 6 (six) hours as needed.  30 tablet  0  . omeprazole (PRILOSEC) 20 MG capsule Take 1 capsule (20 mg total) by mouth daily.  30 capsule  0  . traMADol (ULTRAM) 50 MG tablet Take 1 tablet (50 mg total) by mouth every 6 (six) hours as needed.  15 tablet  0   No current facility-administered medications for this visit.    Allergies  Allergen Reactions  . Other     Dust mites, ragweed, cigarette smoke, cockroach    History   Social History  . Marital Status: Single    Spouse Name: N/A    Number of Children: N/A  . Years of Education:  N/A   Occupational History  . Not on file.   Social History Main Topics  . Smoking status: Former Games developer  . Smokeless tobacco: Never Used  . Alcohol Use: No  . Drug Use: No  . Sexual Activity: Yes    Birth Control/ Protection: Condom   Other Topics Concern  . Not on file   Social History Narrative  . No narrative on file     Review of Systems: General: negative for chills, fever, night sweats or weight changes.  Cardiovascular: negative for chest pain, dyspnea on exertion, edema, orthopnea,  palpitations, paroxysmal nocturnal dyspnea or shortness of breath Dermatological: negative for rash Respiratory: negative for cough or wheezing Urologic: negative for hematuria Abdominal: negative for nausea, vomiting, diarrhea, bright red blood per rectum, melena, or hematemesis Neurologic: negative for visual changes, syncope, or dizziness All other systems reviewed and are otherwise negative except as noted above.    Blood pressure 130/84, pulse 67, height 5\' 9"  (1.753 m), weight 205 lb 11.2 oz (93.305 kg).  General appearance: alert, cooperative and no distress Neck: no carotid bruit and no JVD Lungs: clear to auscultation bilaterally Heart: regular rate and rhythm, S1, S2 normal, no murmur, click, rub or gallop Extremities: no LEE Pulses: 2+ and symmetric Skin: warm and dry Neurologic: Grossly normal  EKG NSR; HR 67 bpm  ASSESSMENT AND PLAN:   1. Pericarditis: Symptoms significantly improved with treatment with NSAIDS. He has discontinued scheduled Ibuprofen as pain has resolved. He has not had any symptoms concerning for angina and has very little risk factors. No need for any further cardiac w/u. We reviewed signs/symptoms of coronary ischemia/ ACS. He understands to f/u if these symptoms occur. F/u with Dr. Patty Sermons as needed.   PLAN  F/u with Dr. Patty Sermons as needed.   Valory Wetherby, BRITTAINYPA-C 01/08/2014 9:19 AM

## 2014-01-09 ENCOUNTER — Encounter: Payer: Self-pay | Admitting: Cardiology

## 2014-01-10 ENCOUNTER — Encounter: Payer: Self-pay | Admitting: Cardiology

## 2014-03-08 ENCOUNTER — Emergency Department (HOSPITAL_COMMUNITY)
Admission: EM | Admit: 2014-03-08 | Discharge: 2014-03-09 | Disposition: A | Discharge status: Home / Self Care | Payer: 59 | Attending: Emergency Medicine | Admitting: Emergency Medicine

## 2014-03-08 ENCOUNTER — Encounter (HOSPITAL_COMMUNITY): Payer: Self-pay | Admitting: Emergency Medicine

## 2014-03-08 DIAGNOSIS — R509 Fever, unspecified: Secondary | ICD-10-CM | POA: Diagnosis not present

## 2014-03-08 DIAGNOSIS — L299 Pruritus, unspecified: Secondary | ICD-10-CM | POA: Insufficient documentation

## 2014-03-08 DIAGNOSIS — Z87891 Personal history of nicotine dependence: Secondary | ICD-10-CM | POA: Insufficient documentation

## 2014-03-08 DIAGNOSIS — Z79899 Other long term (current) drug therapy: Secondary | ICD-10-CM | POA: Insufficient documentation

## 2014-03-08 DIAGNOSIS — R51 Headache: Secondary | ICD-10-CM | POA: Insufficient documentation

## 2014-03-08 DIAGNOSIS — I10 Essential (primary) hypertension: Secondary | ICD-10-CM | POA: Insufficient documentation

## 2014-03-08 DIAGNOSIS — R110 Nausea: Secondary | ICD-10-CM | POA: Diagnosis present

## 2014-03-08 DIAGNOSIS — Z862 Personal history of diseases of the blood and blood-forming organs and certain disorders involving the immune mechanism: Secondary | ICD-10-CM | POA: Insufficient documentation

## 2014-03-08 DIAGNOSIS — Z8639 Personal history of other endocrine, nutritional and metabolic disease: Secondary | ICD-10-CM | POA: Insufficient documentation

## 2014-03-08 MED ORDER — ONDANSETRON 8 MG PO TBDP
8.0000 mg | ORAL_TABLET | Freq: Once | ORAL | Status: AC
  Administered 2014-03-08: 8 mg via ORAL
  Filled 2014-03-08: qty 1

## 2014-03-08 NOTE — ED Notes (Signed)
Pt c/o feeling nauseous all day; last BM today; pt states he feels like he needs to vomit but nothing comes up

## 2014-03-08 NOTE — ED Provider Notes (Signed)
CSN: [id number]     Arrival date & time 03/08/14  2249 History   This chart was scribed for Joya Gaskins, MD by Modena Jansky, ED Scribe. This patient was seen in room APA19/APA19 and the patient's care was started at 11:22 PM.  Chief Complaint  Patient presents with  . Nausea    Patient is a 53 y.o. male presenting with general illness. The history is provided by the patient. No language interpreter was used.  Illness Severity:  Moderate Onset quality:  Sudden Timing:  Constant Progression:  Unchanged Chronicity:  New Context:  Nausea started upon waking up Associated symptoms: fever and nausea   Associated symptoms: no abdominal pain, no chest pain, no diarrhea, no shortness of breath and no vomiting    HPI Comments: Isaac Lopez is a 53 y.o. male who presents to the Emergency Department complaining of constant moderate nausea that started today. He states that he woke up with nausea and his stomach was "rumbling". He reports that he had some headache and a subjective fever. Pt's temperature in the ED today was 99.7. He states that his last BM was this morning without any black/bloody stools. He reports no use of medication on a daily basis. He denies any hematuria, diarrhea, emesis, SOB, chest pain, back pain, dizziness, abdominal pain, or dysuria.   Pt complains of ear itching from a previously partially resolved ear infection.  Past Medical History  Diagnosis Date  . Hypertension   . Hyperlipemia   . Chest pain    Past Surgical History  Procedure Laterality Date  . Bunionectomy     Family History  Problem Relation Age of Onset  . Diabetes Mother   . Cancer Father   . Thyroid disease Sister   . Thyroid disease Other    History  Substance Use Topics  . Smoking status: Former Games developer  . Smokeless tobacco: Never Used  . Alcohol Use: No    Review of Systems  Constitutional: Positive for fever.  Eyes: Positive for itching.  Respiratory: Negative for shortness of  breath.   Cardiovascular: Negative for chest pain.  Gastrointestinal: Positive for nausea. Negative for vomiting, abdominal pain, diarrhea and blood in stool.  Genitourinary: Negative for dysuria.  Musculoskeletal: Negative for back pain.  Neurological: Negative for dizziness.  All other systems reviewed and are negative.     Allergies  Other  Home Medications   Prior to Admission medications   Medication Sig Start Date End Date Taking? Authorizing Provider  ibuprofen (ADVIL,MOTRIN) 200 MG tablet Take 200 mg by mouth every 6 (six) hours as needed for moderate pain (chest pain).    Historical Provider, MD  ibuprofen (ADVIL,MOTRIN) 600 MG tablet Take 1 tablet (600 mg total) by mouth every 6 (six) hours as needed. 12/16/13   Glynn Octave, MD  omeprazole (PRILOSEC) 20 MG capsule Take 1 capsule (20 mg total) by mouth daily. 12/16/13   Glynn Octave, MD  traMADol (ULTRAM) 50 MG tablet Take 1 tablet (50 mg total) by mouth every 6 (six) hours as needed. 12/12/13   Richardean Canal, MD   BP 140/91  Pulse 98  Temp(Src) 99.7 F (37.6 C) (Oral)  Resp 20  Ht  (1.753 m)  Wt 208 lb (94.348 kg)  BMI 30.70 kg/m2  SpO2 93% Physical Exam  Nursing note and vitals reviewed.  CONSTITUTIONAL: Well developed/well nourished HEAD: Normocephalic/atraumatic EYES: EOMI/PERRL, no icterus  ENMT: Mucous membranes moist, bilateral TMs clear and intact  NECK: supple no meningeal  signs SPINE:entire spine nontender CV: S1/S2 noted, no murmurs/rubs/gallops noted LUNGS: Lungs are clear to auscultation bilaterally, no apparent distress ABDOMEN: soft, nontender, no rebound or guarding GU:no cva tenderness NEURO: Pt is awake/alert, moves all extremitiesx4, no arm or leg drift, no ataxia  EXTREMITIES: pulses normal, full ROM SKIN: warm, color normal PSYCH: no abnormalities of mood noted  ED Course  Procedures COORDINATION OF CARE: 11:26 PM- Pt advised of plan for treatment which includes medication and  labs and pt agrees.  Pt improved, no distress, watching TV I doubt ACS No signs of acute abdominal emergency Appropriate for d/c home He requests zofran for homegoing  Labs Review Labs Reviewed  CBC WITH DIFFERENTIAL - Abnormal; Notable for the following:    MCH 25.8 (*)    Platelets 134 (*)    Monocytes Relative 14 (*)    All other components within normal limits  COMPREHENSIVE METABOLIC PANEL - Abnormal; Notable for the following:    Glucose, Bld 113 (*)    Creatinine, Ser 1.37 (*)    GFR calc non Af Amer 57 (*)    GFR calc Af Amer 66 (*)    All other components within normal limits    EKG Interpretation   Date/Time:  Saturday March 08 2014 23:42:17 EDT Ventricular Rate:  100 PR Interval:  156 QRS Duration: 77 QT Interval:  321 QTC Calculation: 414 R Axis:   11 Text Interpretation:  Sinus tachycardia Low voltage, precordial leads  Abnormal R-wave progression, early transition Nonspecific T abnrm,  anterolateral leads T wave changes in V3, V4, V5 similar to prior  Confirmed by Bebe Shaggy  MD, Samariya Rockhold ([id number]) on 03/08/2014 11:47:52 PM      MDM   Final diagnoses:  Nausea    Nursing notes including past medical history and social history reviewed and considered in documentation  I personally performed the services described in this documentation, which was scribed in my presence. The recorded information has been reviewed and is accurate.      Joya Gaskins, MD 03/09/14 [phone]

## 2014-03-09 LAB — CBC WITH DIFFERENTIAL/PLATELET
Basophils Absolute: 0 10*3/uL (ref 0.0–0.1)
Basophils Relative: 0 % (ref 0–1)
Eosinophils Absolute: 0 10*3/uL (ref 0.0–0.7)
Eosinophils Relative: 1 % (ref 0–5)
HCT: 39.6 % (ref 39.0–52.0)
Hemoglobin: 13 g/dL (ref 13.0–17.0)
LYMPHS PCT: 28 % (ref 12–46)
Lymphs Abs: 1.3 10*3/uL (ref 0.7–4.0)
MCH: 25.8 pg — ABNORMAL LOW (ref 26.0–34.0)
MCHC: 32.8 g/dL (ref 30.0–36.0)
MCV: 78.6 fL (ref 78.0–100.0)
MONOS PCT: 14 % — AB (ref 3–12)
Monocytes Absolute: 0.7 10*3/uL (ref 0.1–1.0)
NEUTROS ABS: 2.6 10*3/uL (ref 1.7–7.7)
NEUTROS PCT: 57 % (ref 43–77)
Platelets: 134 10*3/uL — ABNORMAL LOW (ref 150–400)
RBC: 5.04 MIL/uL (ref 4.22–5.81)
RDW: 14.6 % (ref 11.5–15.5)
WBC: 4.6 10*3/uL (ref 4.0–10.5)

## 2014-03-09 LAB — COMPREHENSIVE METABOLIC PANEL
ALT: 12 U/L (ref 0–53)
AST: 16 U/L (ref 0–37)
Albumin: 3.8 g/dL (ref 3.5–5.2)
Alkaline Phosphatase: 115 U/L (ref 39–117)
Anion gap: 11 (ref 5–15)
BUN: 15 mg/dL (ref 6–23)
CALCIUM: 9.3 mg/dL (ref 8.4–10.5)
CO2: 28 meq/L (ref 19–32)
CREATININE: 1.37 mg/dL — AB (ref 0.50–1.35)
Chloride: 98 mEq/L (ref 96–112)
GFR calc Af Amer: 66 mL/min — ABNORMAL LOW (ref >90)
GFR, EST NON AFRICAN AMERICAN: 57 mL/min — AB (ref >90)
Glucose, Bld: 113 mg/dL — ABNORMAL HIGH (ref 70–99)
Potassium: 4 mEq/L (ref 3.7–5.3)
Sodium: 137 mEq/L (ref 137–147)
TOTAL PROTEIN: 7.3 g/dL (ref 6.0–8.3)
Total Bilirubin: 0.5 mg/dL (ref 0.3–1.2)

## 2014-03-09 MED ORDER — ONDANSETRON HCL 8 MG PO TABS: 8.0000 mg | ORAL_TABLET | Freq: Three times a day (TID) | ORAL | Status: DC | PRN

## 2014-03-09 NOTE — Discharge Instructions (Signed)

## 2014-03-09 NOTE — ED Notes (Signed)
Pt alert & oriented x4, stable gait. Patient given discharge instructions, paperwork & prescription(s). Patient  instructed to stop at the registration desk to finish any additional paperwork. Patient verbalized understanding. Pt left department w/ no further questions. 

## 2014-12-02 DIAGNOSIS — K838 Other specified diseases of biliary tract: Secondary | ICD-10-CM | POA: Insufficient documentation

## 2014-12-02 DIAGNOSIS — R079 Chest pain, unspecified: Secondary | ICD-10-CM | POA: Insufficient documentation

## 2015-12-21 IMAGING — CT CT ABD-PELV W/ CM
2 of 5 series · 17 of 46 positions shown, 19 images · IV contrast (OMNIPAQUE)
Comparison: 01/15/2011

CLINICAL DATA: Right lower quadrant pain

EXAM:
CT ABDOMEN AND PELVIS WITH CONTRAST
TECHNIQUE: Multidetector CT imaging of the abdomen and pelvis was performed
using the standard protocol following bolus administration of
intravenous contrast.
CONTRAST:  50mL OMNIPAQUE IOHEXOL 300 MG/ML SOLN, 100mL OMNIPAQUE
IOHEXOL 300 MG/ML SOLN

[Series 2: rtn a/p with · axial · 0.73mm/px · z∈[-468,-98]mm · 14 of 84 slices shown, 16 images]
[im 5/84  soft-tissue]
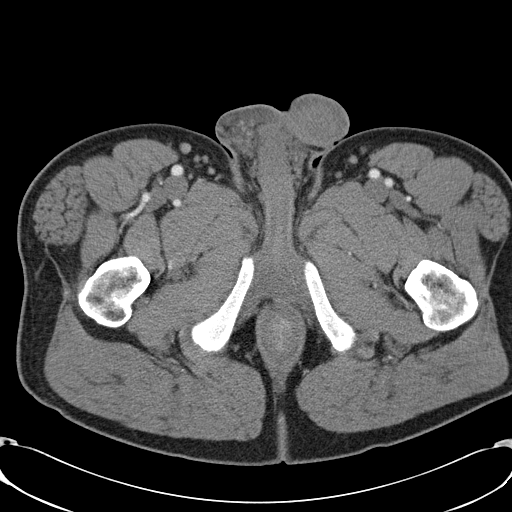
[im 5/84  bone]
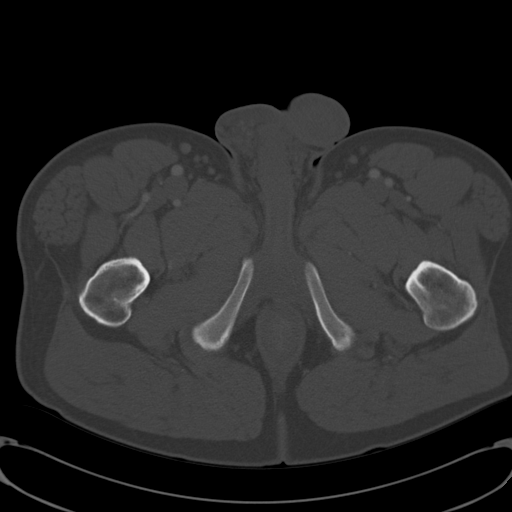
[im 13/84  soft-tissue]
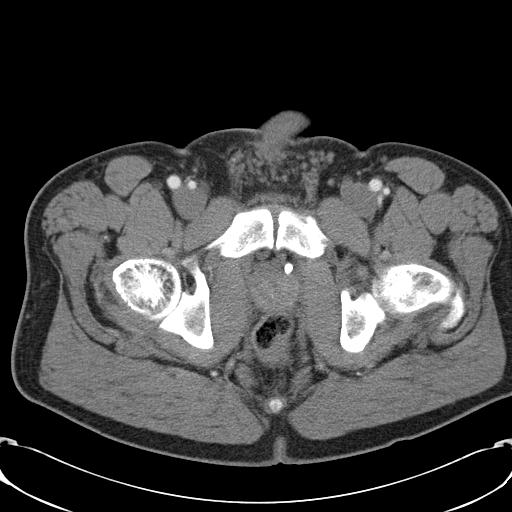
[im 17/84  soft-tissue]
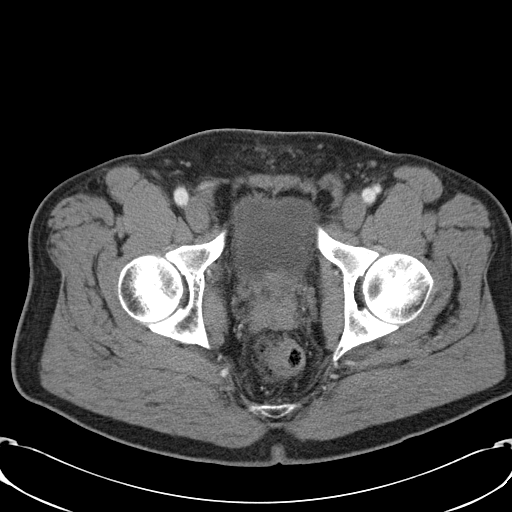
[im 21/84  soft-tissue]
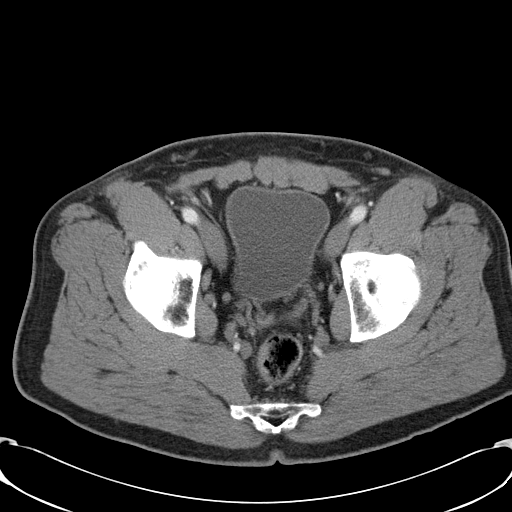
[im 30/84  soft-tissue]
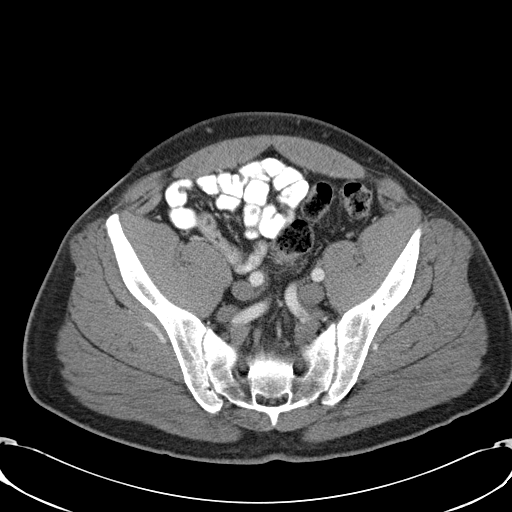
[im 34/84  soft-tissue]
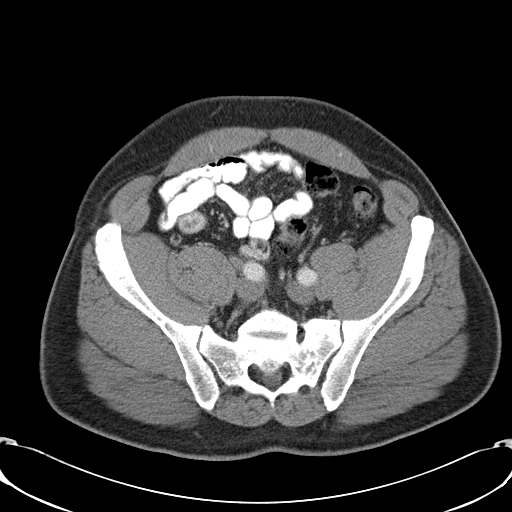
[im 38/84  soft-tissue]
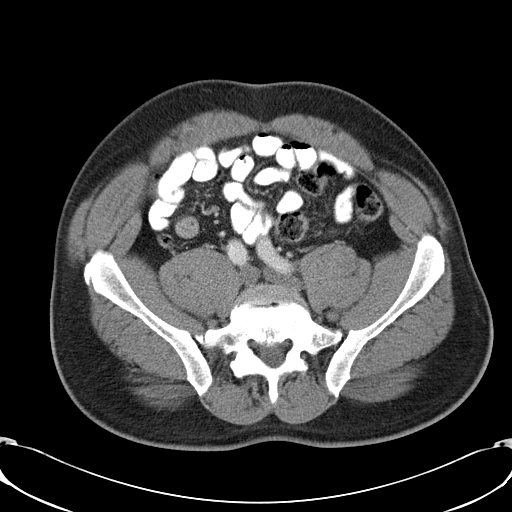
[im 46/84  soft-tissue]
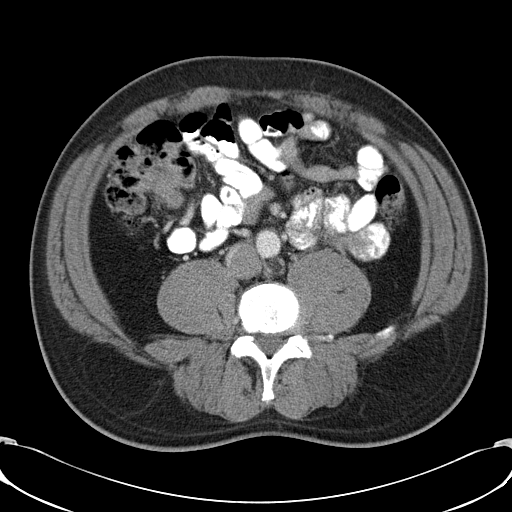
[im 50/84  soft-tissue]
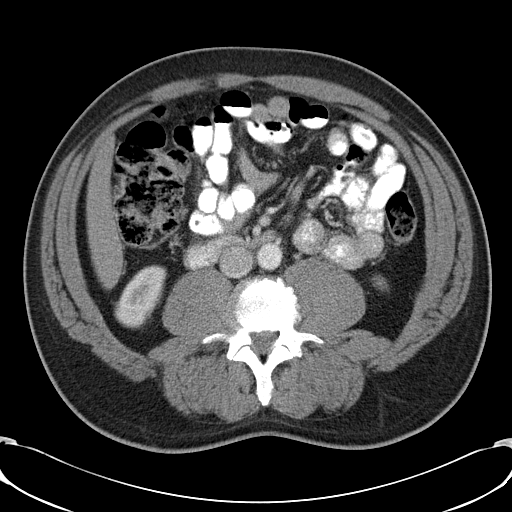
[im 50/84  bone]
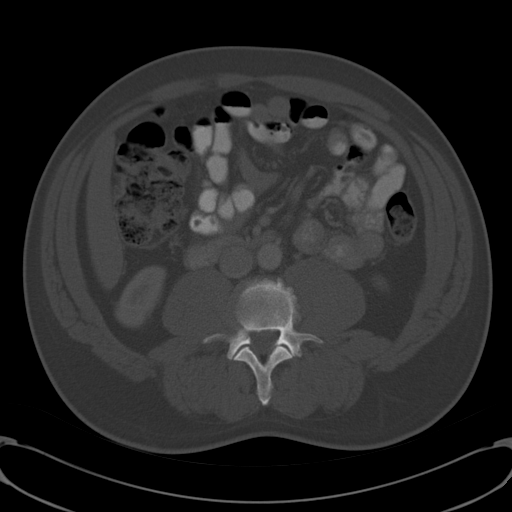
[im 54/84  soft-tissue]
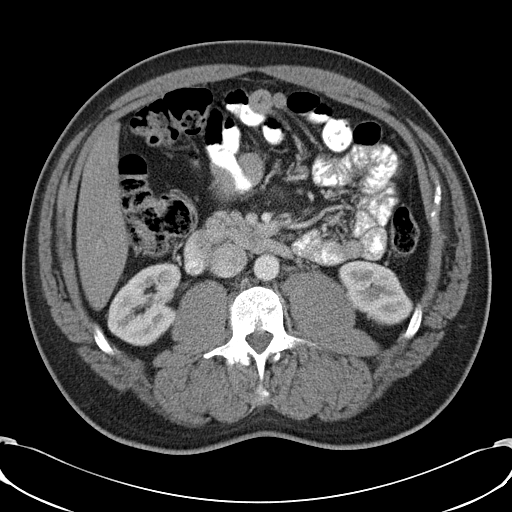
[im 63/84  soft-tissue]
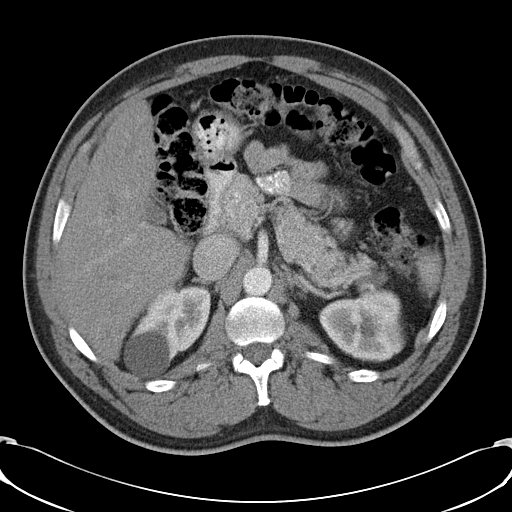
[im 67/84  soft-tissue]
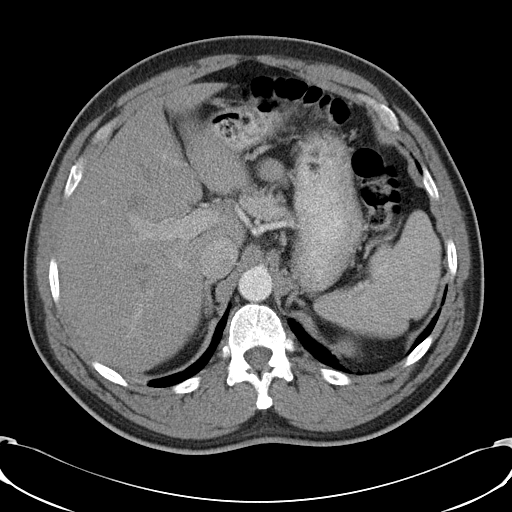
[im 71/84  soft-tissue]
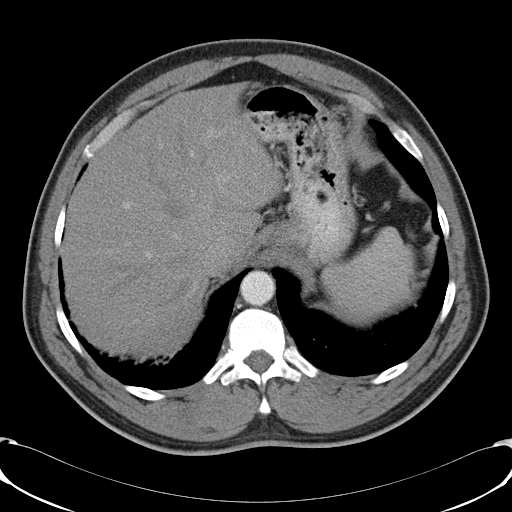
[im 79/84  soft-tissue]
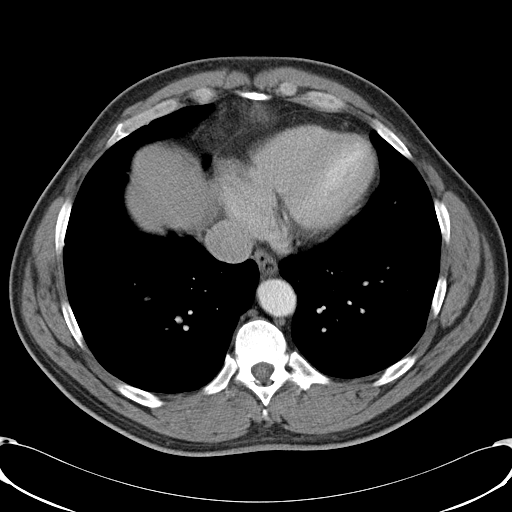

[Series 602: <mpr thick range> · coronal · 0.81mm/px · 3 of 92 slices shown]
[im 31/92  soft-tissue]
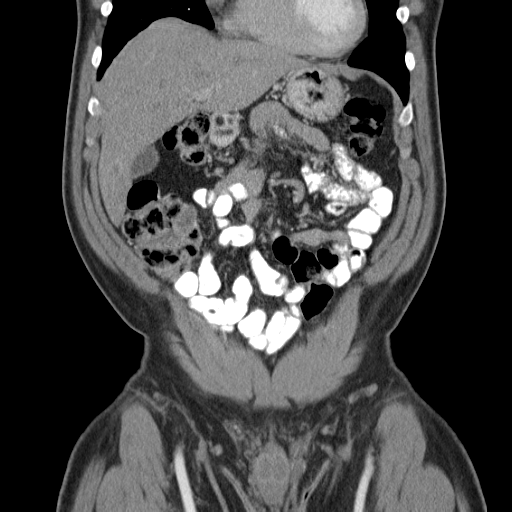
[im 41/92  soft-tissue]
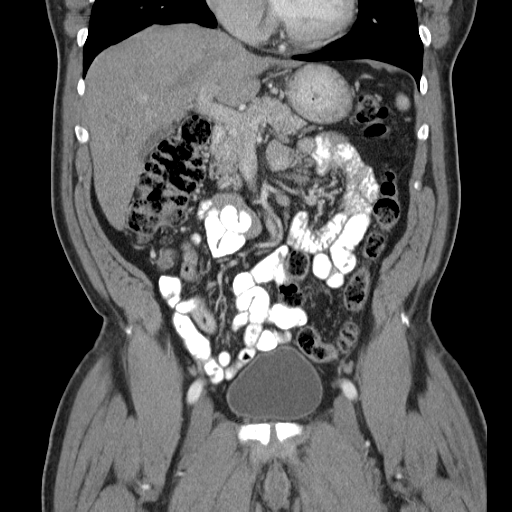
[im 51/92  soft-tissue]
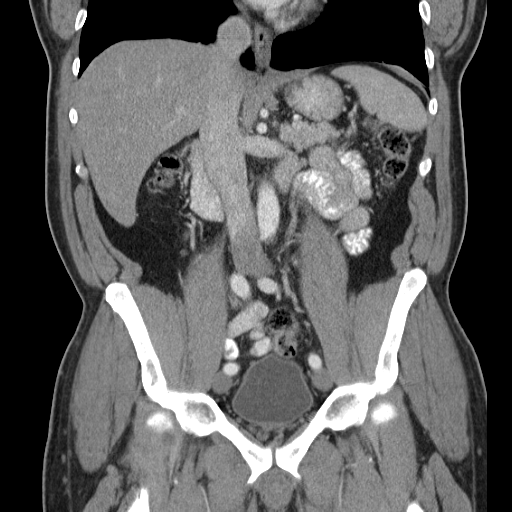

[17 of 46 positions shown; findings below may reference images not displayed]

FINDINGS: The lung bases are clear.

The liver demonstrates no focal abnormality. There is no
intrahepatic or extrahepatic biliary ductal dilatation. The
gallbladder is normal. The spleen demonstrates no focal
abnormality.There is a 3.5 cm hypodense, fluid attenuating right
renal mass most consistent with a cyst. The left kidney, adrenal
glands and pancreas are normal. The bladder is unremarkable.

The stomach, duodenum, small intestine, and large intestine
demonstrate no contrast extravasation or dilatation. There is a
normal caliber appendix in the right lower quadrant without
periappendiceal inflammatory changes. There is no pneumoperitoneum,
pneumatosis, or portal venous gas. There is no abdominal or pelvic
free fluid. Just posterior to the aortic bifurcation there is a
stable 11 mm lymph node.

The abdominal aorta is normal in caliber.

There are no lytic or sclerotic osseous lesions.
IMPRESSION: No acute abdominal or pelvic pathology.

## 2016-11-10 ENCOUNTER — Encounter (HOSPITAL_COMMUNITY): Payer: Self-pay

## 2016-11-10 ENCOUNTER — Emergency Department (HOSPITAL_COMMUNITY)
Admission: EM | Admit: 2016-11-10 | Discharge: 2016-11-10 | Disposition: A | Discharge status: Home / Self Care | Payer: Medicaid - Out of State | Attending: Emergency Medicine | Admitting: Emergency Medicine

## 2016-11-10 DIAGNOSIS — H9202 Otalgia, left ear: Secondary | ICD-10-CM | POA: Diagnosis present

## 2016-11-10 DIAGNOSIS — H6692 Otitis media, unspecified, left ear: Secondary | ICD-10-CM | POA: Diagnosis not present

## 2016-11-10 DIAGNOSIS — Z79899 Other long term (current) drug therapy: Secondary | ICD-10-CM | POA: Diagnosis not present

## 2016-11-10 DIAGNOSIS — H6690 Otitis media, unspecified, unspecified ear: Secondary | ICD-10-CM

## 2016-11-10 DIAGNOSIS — Z87891 Personal history of nicotine dependence: Secondary | ICD-10-CM | POA: Diagnosis not present

## 2016-11-10 DIAGNOSIS — H60502 Unspecified acute noninfective otitis externa, left ear: Secondary | ICD-10-CM

## 2016-11-10 DIAGNOSIS — I10 Essential (primary) hypertension: Secondary | ICD-10-CM | POA: Insufficient documentation

## 2016-11-10 MED ORDER — NEOMYCIN-POLYMYXIN-HC 1 % OT SOLN
3.0000 [drp] | Freq: Once | OTIC | Status: AC
  Administered 2016-11-10: 3 [drp] via OTIC

## 2016-11-10 MED ORDER — NEOMYCIN-POLYMYXIN-HC 3.5-10000-1 OT SUSP: 3.0000 [drp] | Freq: Four times a day (QID) | OTIC | 0 refills | Status: DC

## 2016-11-10 MED ORDER — AMOXICILLIN-POT CLAVULANATE 875-125 MG PO TABS: 1.0000 | ORAL_TABLET | Freq: Two times a day (BID) | ORAL | 0 refills | Status: DC

## 2016-11-10 MED ORDER — NEOMYCIN-POLYMYXIN-HC 1 % OT SOLN
OTIC | Status: AC
  Administered 2016-11-10: 3 [drp] via OTIC
  Filled 2016-11-10: qty 10

## 2016-11-10 NOTE — ED Triage Notes (Signed)
Pt reports sore in left ear for approx 1 week.  Says has been using peroxide but not helping.

## 2016-11-10 NOTE — ED Provider Notes (Signed)
[facility]-EMERGENCY DEPT Provider Note   CSN: [id number] Arrival date & time: 11/10/16  4098     History   Chief Complaint Chief Complaint  Patient presents with  . Otalgia    HPI Isaac Lopez is a 56 y.o. male.  He presents for evaluation of left ear pain present for several days with decreased hearing, and painful to touch.  He also complains of "allergies".  He describes seasonal allergies with nasal congestion and sneezing, occasionally.  He is visiting from out of town.  He denies fever, chills, cough, chest pain, weakness or dizziness.  There are no other known modifying factors.  HPI  Past Medical History:  Diagnosis Date  . Chest pain   . Hyperlipemia   . Hypertension     There are no active problems to display for this patient.   Past Surgical History:  Procedure Laterality Date  . BUNIONECTOMY         Home Medications    Prior to Admission medications   Medication Sig Start Date End Date Taking? Authorizing Provider  amoxicillin-clavulanate (AUGMENTIN) 875-125 MG tablet Take 1 tablet by mouth 2 (two) times daily. One po bid x 7 days 11/10/16   Mancel Bale, MD  neomycin-polymyxin-hydrocortisone (CORTISPORIN) 3.5-10000-1 otic suspension Place 3 drops into the left ear 4 (four) times daily. X 7 days 11/10/16   Mancel Bale, MD  ondansetron (ZOFRAN) 8 MG tablet Take 1 tablet (8 mg total) by mouth every 8 (eight) hours as needed. 03/09/14   Zadie Rhine, MD    Family History Family History  Problem Relation Age of Onset  . Diabetes Mother   . Cancer Father   . Thyroid disease Sister   . Thyroid disease Other     Social History Social History  Substance Use Topics  . Smoking status: Former Games developer  . Smokeless tobacco: Never Used  . Alcohol use No     Allergies   Other   Review of Systems Review of Systems  All other systems reviewed and are negative.    Physical Exam Updated Vital Signs BP (!) 132/99 (BP Location: Left Arm)   Pulse  (!) 104   Temp 98.9 F (37.2 C) (Oral)   Resp 18   Ht 5\' 9"  (1.753 m)   Wt 98.9 kg (218 lb)   SpO2 94%   BMI 32.19 kg/m   Physical Exam  Constitutional: He is oriented to person, place, and time. He appears well-developed and well-nourished.  HENT:  Head: Normocephalic and atraumatic.  Right Ear: External ear normal.  Tender left tragus, and left preauricular region without distinct palpable adenopathy.  Left external auditory canal is diffusely swollen, mild, with some erythema but no exudate.  Left TM is distorted with an opaque appearance and erythema anterior upper quadrant.  No evident perforation.  Right TM somewhat opaque.  Right auditory canal normal  Eyes: Conjunctivae and EOM are normal. Pupils are equal, round, and reactive to light.  Neck: Normal range of motion and phonation normal. Neck supple.  Cardiovascular: Normal rate, regular rhythm and normal heart sounds.   Pulmonary/Chest: Effort normal and breath sounds normal. He exhibits no bony tenderness.  Musculoskeletal: Normal range of motion.  Neurological: He is alert and oriented to person, place, and time. No cranial nerve deficit or sensory deficit. He exhibits normal muscle tone. Coordination normal.  Skin: Skin is warm, dry and intact.  Psychiatric: He has a normal mood and affect. His behavior is normal. Judgment and thought content normal.  Nursing note and vitals reviewed.    ED Treatments / Results  Labs (all labs ordered are listed, but only abnormal results are displayed) Labs Reviewed - No data to display  EKG  EKG Interpretation None       Radiology No results found.  Procedures Procedures (including critical care time)  Medications Ordered in ED Medications - No data to display   Initial Impression / Assessment and Plan / ED Course  I have reviewed the triage vital signs and the nursing notes.  Pertinent labs & imaging results that were available during my care of the patient were  reviewed by me and considered in my medical decision making (see chart for details).      Patient Vitals for the past 24 hrs:  BP Temp Temp src Pulse Resp SpO2 Height Weight  11/10/16 0738 - - - - - - 5\' 9"  (1.753 m) 98.9 kg (218 lb)  11/10/16 0737 (!) 132/99 98.9 F (37.2 C) Oral (!) 104 18 94 % - -    8:08 AM Reevaluation with update and discussion. After initial assessment and treatment, an updated evaluation reveals no change in clinical status.  Findings discussed with the patient and all questions answered. Gizell Danser L    Final Clinical Impressions(s) / ED Diagnoses   Final diagnoses:  Acute otitis externa of left ear, unspecified type  Acute otitis media, unspecified otitis media type    Left ear pain secondary to otitis externa, and likely otitis media.  Etiology may be related to seasonal allergies.  Doubt serious bacterial infection, metabolic instability or impending vascular collapse.  Nursing Notes Reviewed/ Care Coordinated Applicable Imaging Reviewed Interpretation of Laboratory Data incorporated into ED treatment  The patient appears reasonably screened and/or stabilized for discharge and I doubt any other medical condition or other [facility] requiring further screening, evaluation, or treatment in the ED at this time prior to discharge.  Plan: Home Medications-OTC analgesia; Home Treatments-heat applications for pain relief; return here if the recommended treatment, does not improve the symptoms; Recommended follow up-PCP or return here as needed   New Prescriptions New Prescriptions   AMOXICILLIN-CLAVULANATE (AUGMENTIN) 875-125 MG TABLET    Take 1 tablet by mouth 2 (two) times daily. One po bid x 7 days   NEOMYCIN-POLYMYXIN-HYDROCORTISONE (CORTISPORIN) 3.5-10000-1 OTIC SUSPENSION    Place 3 drops into the left ear 4 (four) times daily. X 7 days     Mancel BaleWentz, Latrell Potempa, MD 11/10/16 [phone]

## 2016-11-10 NOTE — Discharge Instructions (Signed)
Use ibuprofen, or acetaminophen, for pain.  Try using a warm compress or heating pad on the sore area of your left ear.  Return here, if needed, for problems.

## 2017-04-08 ENCOUNTER — Encounter (HOSPITAL_COMMUNITY): Payer: Self-pay

## 2017-04-08 ENCOUNTER — Emergency Department (HOSPITAL_COMMUNITY)
Admission: EM | Admit: 2017-04-08 | Discharge: 2017-04-08 | Disposition: A | Discharge status: Home / Self Care | Payer: 59 | Attending: Emergency Medicine | Admitting: Emergency Medicine

## 2017-04-08 DIAGNOSIS — I10 Essential (primary) hypertension: Secondary | ICD-10-CM | POA: Insufficient documentation

## 2017-04-08 DIAGNOSIS — E785 Hyperlipidemia, unspecified: Secondary | ICD-10-CM | POA: Insufficient documentation

## 2017-04-08 DIAGNOSIS — R197 Diarrhea, unspecified: Secondary | ICD-10-CM

## 2017-04-08 DIAGNOSIS — Z87891 Personal history of nicotine dependence: Secondary | ICD-10-CM | POA: Insufficient documentation

## 2017-04-08 LAB — COMPREHENSIVE METABOLIC PANEL
ALT: 13 U/L — AB (ref 17–63)
AST: 22 U/L (ref 15–41)
Albumin: 3.8 g/dL (ref 3.5–5.0)
Alkaline Phosphatase: 101 U/L (ref 38–126)
Anion gap: 9 (ref 5–15)
BUN: 16 mg/dL (ref 6–20)
CHLORIDE: 107 mmol/L (ref 101–111)
CO2: 22 mmol/L (ref 22–32)
CREATININE: 1.26 mg/dL — AB (ref 0.61–1.24)
Calcium: 8.7 mg/dL — ABNORMAL LOW (ref 8.9–10.3)
GFR calc Af Amer: >60 mL/min (ref >60)
GLUCOSE: 107 mg/dL — AB (ref 65–99)
POTASSIUM: 3.6 mmol/L (ref 3.5–5.1)
Sodium: 138 mmol/L (ref 135–145)
Total Bilirubin: 0.5 mg/dL (ref 0.3–1.2)
Total Protein: 7.1 g/dL (ref 6.5–8.1)

## 2017-04-08 LAB — GASTROINTESTINAL PANEL BY PCR, STOOL (REPLACES STOOL CULTURE)
Adenovirus F40/41: NOT DETECTED
Astrovirus: NOT DETECTED
CAMPYLOBACTER SPECIES: NOT DETECTED
CRYPTOSPORIDIUM: NOT DETECTED
Cyclospora cayetanensis: NOT DETECTED
ENTEROAGGREGATIVE E COLI (EAEC): NOT DETECTED
Entamoeba histolytica: NOT DETECTED
Enteropathogenic E coli (EPEC): NOT DETECTED
Enterotoxigenic E coli (ETEC): NOT DETECTED
Giardia lamblia: NOT DETECTED
NOROVIRUS GI/GII: NOT DETECTED
PLESIMONAS SHIGELLOIDES: NOT DETECTED
ROTAVIRUS A: NOT DETECTED
SAPOVIRUS (I, II, IV, AND V): NOT DETECTED
SHIGA LIKE TOXIN PRODUCING E COLI (STEC): NOT DETECTED
SHIGELLA/ENTEROINVASIVE E COLI (EIEC): NOT DETECTED
Salmonella species: NOT DETECTED
Vibrio cholerae: NOT DETECTED
Vibrio species: NOT DETECTED
YERSINIA ENTEROCOLITICA: NOT DETECTED

## 2017-04-08 LAB — CBC
HEMATOCRIT: 38.6 % — AB (ref 39.0–52.0)
Hemoglobin: 12.7 g/dL — ABNORMAL LOW (ref 13.0–17.0)
MCH: 25.4 pg — ABNORMAL LOW (ref 26.0–34.0)
MCHC: 32.9 g/dL (ref 30.0–36.0)
MCV: 77.2 fL — AB (ref 78.0–100.0)
PLATELETS: 127 10*3/uL — AB (ref 150–400)
RBC: 5 MIL/uL (ref 4.22–5.81)
RDW: 14.6 % (ref 11.5–15.5)
WBC: 3.6 10*3/uL — ABNORMAL LOW (ref 4.0–10.5)

## 2017-04-08 LAB — LIPASE, BLOOD: LIPASE: 27 U/L (ref 11–51)

## 2017-04-08 MED ORDER — DICYCLOMINE HCL 20 MG PO TABS: 20.0000 mg | ORAL_TABLET | Freq: Two times a day (BID) | ORAL | 0 refills | Status: DC | PRN

## 2017-04-08 MED ORDER — SODIUM CHLORIDE 0.9 % IV BOLUS (SEPSIS)
1000.0000 mL | Freq: Once | INTRAVENOUS | Status: AC
  Administered 2017-04-08: 1000 mL via INTRAVENOUS

## 2017-04-08 NOTE — ED Notes (Signed)
Pt urinated before coming to the hospital; he is unable to provide a urine specimen at this time.

## 2017-04-08 NOTE — ED Provider Notes (Signed)
[facility]-EMERGENCY DEPT Provider Note   CSN: [id number] Arrival date & time: 04/08/17  0346     History   Chief Complaint Chief Complaint  Patient presents with  . Diarrhea    HPI Isaac Lopez is a 56 y.o. male.  56 year old male with a history of chest pain, dyslipidemia, hypertension presents to the emergency department for evaluation of diarrhea. He states that diarrhea began 3 days ago while at work. He arrived home early Friday morning and took one tablet of Imodium. He states that his diarrhea largely improved up until this evening. He notes abdominal cramping as well as 7 episodes of diarrhea tonight. Diarrhea characterized as watery with small amounts of blood mixed with brown stool. No passage of clots. Patient notes improvement in his abdominal cramping. Denies abdominal pain at this time. He had a bad salad from [facility] prior to onset of his symptoms. This was in addition to ribs, green peas, and corn. He denies any known fever, but does report subjective chills. No nausea, vomiting, urinary symptoms, sick contacts. No history of abdominal surgeries.      Past Medical History:  Diagnosis Date  . Chest pain   . Hyperlipemia   . Hypertension     There are no active problems to display for this patient.   Past Surgical History:  Procedure Laterality Date  . BUNIONECTOMY        Home Medications    Prior to Admission medications   Medication Sig Start Date End Date Taking? Authorizing Provider  Pseudoephedrine-APAP-DM (TYLENOL COLD/FLU SEVERE DAY PO) Take 2 tablets by mouth once.   Yes [provider]  amoxicillin-clavulanate (AUGMENTIN) 875-125 MG tablet Take 1 tablet by mouth 2 (two) times daily. One po bid x 7 days Patient not taking: Reported on 04/08/2017 11/10/16   Mancel Bale, MD  dicyclomine (BENTYL) 20 MG tablet Take 1 tablet (20 mg total) by mouth every 12 (twelve) hours as needed for spasms. For abdominal  pain/cramping 04/08/17   Antony Madura, PA-C  neomycin-polymyxin-hydrocortisone (CORTISPORIN) 3.5-10000-1 otic suspension Place 3 drops into the left ear 4 (four) times daily. X 7 days Patient not taking: Reported on 04/08/2017 11/10/16   Mancel Bale, MD    Family History Family History  Problem Relation Age of Onset  . Diabetes Mother   . Cancer Father   . Thyroid disease Sister   . Thyroid disease Other     Social History Social History  Substance Use Topics  . Smoking status: Former Games developer  . Smokeless tobacco: Never Used  . Alcohol use No     Allergies   Other   Review of Systems Review of Systems Ten systems reviewed and are negative for acute change, except as noted in the HPI.    Physical Exam Updated Vital Signs BP 117/84   Pulse 70   Temp 98.1 F (36.7 C) (Oral)   Resp 15   Ht 5\' 9"  (1.753 m)   Wt 96.6 kg (213 lb)   SpO2 98%   BMI 31.45 kg/m   Physical Exam  Constitutional: He is oriented to person, place, and time. He appears well-developed and well-nourished. No distress.  Nontoxic appearing and in no acute distress  HENT:  Head: Normocephalic and atraumatic.  Eyes: Conjunctivae and EOM are normal. No scleral icterus.  Neck: Normal range of motion.  Cardiovascular: Normal rate, regular rhythm and intact distal pulses.   Pulmonary/Chest: Effort normal. No respiratory distress. He has no wheezes.  Respirations even  and unlabored.  Abdominal: Soft. He exhibits no mass. There is no tenderness. There is no guarding.  Soft, mildly distended, nontender abdomen.  Musculoskeletal: Normal range of motion.  Neurological: He is alert and oriented to person, place, and time. He exhibits normal muscle tone. Coordination normal.  Ambulatory with steady gait  Skin: Skin is warm and dry. No rash noted. He is not diaphoretic. No erythema. No pallor.  Psychiatric: He has a normal mood and affect. His behavior is normal.  Nursing note and vitals  reviewed.    ED Treatments / Results  Labs (all labs ordered are listed, but only abnormal results are displayed) Labs Reviewed  COMPREHENSIVE METABOLIC PANEL - Abnormal; Notable for the following:       Result Value   Glucose, Bld 107 (*)    Creatinine, Ser 1.26 (*)    Calcium 8.7 (*)    ALT 13 (*)    All other components within normal limits  CBC - Abnormal; Notable for the following:    WBC 3.6 (*)    Hemoglobin 12.7 (*)    HCT 38.6 (*)    MCV 77.2 (*)    MCH 25.4 (*)    Platelets 127 (*)    All other components within normal limits  GASTROINTESTINAL PANEL BY PCR, STOOL (REPLACES STOOL CULTURE)  LIPASE, BLOOD    EKG  EKG Interpretation None       Radiology No results found.  Procedures Procedures (including critical care time)  Medications Ordered in ED Medications  sodium chloride 0.9 % bolus 1,000 mL (0 mLs Intravenous Stopped 04/08/17 0557)     Initial Impression / Assessment and Plan / ED Course  I have reviewed the triage vital signs and the nursing notes.  Pertinent labs & imaging results that were available during my care of the patient were reviewed by me and considered in my medical decision making (see chart for details).     56 year old male presents to the emergency department for intermittent diarrhea and abdominal cramping. He reports watery brown stool with sporadic small amounts of bright red blood. He denies any passage of clots. Patient afebrile. He has a soft and nontender abdomen on my exam. Laboratory workup reassuring. No electrolyte derangements or significant leukocytosis. Creatinine is at baseline.   On repeat assessment following management with IV fluids, patient reports improvement in his diarrhea and discomfort. He was able to provide a GI panel which is pending. No recent hospitalizations or abx use to suggest concern for C-diff. Quick scan was NOT performed as patient with hx of Imodium use. Have counseled on supportive  management and typical self-limiting nature of a diarrheal illness. Return precautions discussed and provided. Patient discharged in stable condition with no unaddressed concerns.   Vitals:   04/08/17 0400 04/08/17 0600  BP: 120/82 117/84  Pulse: 94 70  Resp: 16 15  Temp: 98.1 F (36.7 C)   TempSrc: Oral   SpO2: 93% 98%  Weight: 96.6 kg (213 lb)   Height: 5\' 9"  (1.753 m)     Final Clinical Impressions(s) / ED Diagnoses   Final diagnoses:  Diarrhea in adult patient    New Prescriptions New Prescriptions   DICYCLOMINE (BENTYL) 20 MG TABLET    Take 1 tablet (20 mg total) by mouth every 12 (twelve) hours as needed for spasms. For abdominal pain/cramping     Antony MaduraHumes, Dian Minahan, PA-C 04/08/17 [id number] [id number]    Knapp, Iva, MD 04/08/17 [phone]

## 2017-04-08 NOTE — Discharge Instructions (Signed)
We recommend Bentyl as prescribed for abdominal cramping/spasms which may develop with your persistent diarrhea. Be sure to drink plenty of clear liquids to prevent dehydration. You may benefit from taking an over-the-counter probiotic. Follow-up with a primary care doctor to ensure resolution of your symptoms. You may return to the emergency department if symptoms persist or worsen.

## 2017-04-08 NOTE — ED Triage Notes (Signed)
Pt reports worsening diarrhea since Thursday. Pt denies recent abx use. Pt reports x7 episodes of diarrhea in the last 24hrs.

## 2017-04-08 NOTE — ED Notes (Signed)
Pt c/o diarrhea onset Thursday. He reports having around 10 episodes of brown, watery stool since then. He has tried Imodium with no results.

## 2019-08-14 ENCOUNTER — Other Ambulatory Visit: Payer: Self-pay

## 2019-08-14 ENCOUNTER — Ambulatory Visit: Payer: Medicaid - Out of State | Attending: Internal Medicine

## 2019-08-14 DIAGNOSIS — Z20822 Contact with and (suspected) exposure to covid-19: Secondary | ICD-10-CM

## 2019-08-15 LAB — NOVEL CORONAVIRUS, NAA: SARS-CoV-2, NAA: NOT DETECTED

## 2019-12-08 ENCOUNTER — Encounter (HOSPITAL_COMMUNITY): Payer: Self-pay | Admitting: *Deleted

## 2019-12-08 ENCOUNTER — Emergency Department (HOSPITAL_COMMUNITY)
Admission: EM | Admit: 2019-12-08 | Discharge: 2019-12-09 | Disposition: A | Discharge status: Home / Self Care | Payer: Managed Care, Other (non HMO) | Attending: Emergency Medicine | Admitting: Emergency Medicine

## 2019-12-08 ENCOUNTER — Other Ambulatory Visit: Payer: Self-pay

## 2019-12-08 DIAGNOSIS — Z87891 Personal history of nicotine dependence: Secondary | ICD-10-CM | POA: Diagnosis not present

## 2019-12-08 DIAGNOSIS — Z9189 Other specified personal risk factors, not elsewhere classified: Secondary | ICD-10-CM

## 2019-12-08 DIAGNOSIS — D696 Thrombocytopenia, unspecified: Secondary | ICD-10-CM | POA: Insufficient documentation

## 2019-12-08 DIAGNOSIS — Z79899 Other long term (current) drug therapy: Secondary | ICD-10-CM | POA: Insufficient documentation

## 2019-12-08 DIAGNOSIS — R509 Fever, unspecified: Secondary | ICD-10-CM

## 2019-12-08 DIAGNOSIS — A681 Tick-borne relapsing fever: Secondary | ICD-10-CM | POA: Diagnosis not present

## 2019-12-08 DIAGNOSIS — Z20822 Contact with and (suspected) exposure to covid-19: Secondary | ICD-10-CM | POA: Insufficient documentation

## 2019-12-08 DIAGNOSIS — I10 Essential (primary) hypertension: Secondary | ICD-10-CM | POA: Insufficient documentation

## 2019-12-08 DIAGNOSIS — R519 Headache, unspecified: Secondary | ICD-10-CM | POA: Diagnosis present

## 2019-12-08 LAB — COMPREHENSIVE METABOLIC PANEL
ALT: 64 U/L — ABNORMAL HIGH (ref 0–44)
AST: 85 U/L — ABNORMAL HIGH (ref 15–41)
Albumin: 3.9 g/dL (ref 3.5–5.0)
Alkaline Phosphatase: 102 U/L (ref 38–126)
Anion gap: 9 (ref 5–15)
BUN: 11 mg/dL (ref 6–20)
CO2: 24 mmol/L (ref 22–32)
Calcium: 9 mg/dL (ref 8.9–10.3)
Chloride: 103 mmol/L (ref 98–111)
Creatinine, Ser: 1.33 mg/dL — ABNORMAL HIGH (ref 0.61–1.24)
GFR calc Af Amer: >60 mL/min (ref >60)
GFR calc non Af Amer: 58 mL/min — ABNORMAL LOW (ref >60)
Glucose, Bld: 108 mg/dL — ABNORMAL HIGH (ref 70–99)
Potassium: 3.9 mmol/L (ref 3.5–5.1)
Sodium: 136 mmol/L (ref 135–145)
Total Bilirubin: 0.7 mg/dL (ref 0.3–1.2)
Total Protein: 7.4 g/dL (ref 6.5–8.1)

## 2019-12-08 LAB — CBC WITH DIFFERENTIAL/PLATELET
Abs Immature Granulocytes: 0.01 10*3/uL (ref 0.00–0.07)
Basophils Absolute: 0 10*3/uL (ref 0.0–0.1)
Basophils Relative: 0 %
Eosinophils Absolute: 0 10*3/uL (ref 0.0–0.5)
Eosinophils Relative: 0 %
HCT: 47 % (ref 39.0–52.0)
Hemoglobin: 14.5 g/dL (ref 13.0–17.0)
Immature Granulocytes: 0 %
Lymphocytes Relative: 11 %
Lymphs Abs: 0.3 10*3/uL — ABNORMAL LOW (ref 0.7–4.0)
MCH: 24.9 pg — ABNORMAL LOW (ref 26.0–34.0)
MCHC: 30.9 g/dL (ref 30.0–36.0)
MCV: 80.8 fL (ref 80.0–100.0)
Monocytes Absolute: 0.2 10*3/uL (ref 0.1–1.0)
Monocytes Relative: 10 %
Neutro Abs: 1.8 10*3/uL (ref 1.7–7.7)
Neutrophils Relative %: 79 %
Platelets: 99 10*3/uL — ABNORMAL LOW (ref 150–400)
RBC: 5.82 MIL/uL — ABNORMAL HIGH (ref 4.22–5.81)
RDW: 14.6 % (ref 11.5–15.5)
WBC: 2.3 10*3/uL — ABNORMAL LOW (ref 4.0–10.5)
nRBC: 0 % (ref 0.0–0.2)

## 2019-12-08 LAB — SARS CORONAVIRUS 2 BY RT PCR (HOSPITAL ORDER, PERFORMED IN ~~LOC~~ HOSPITAL LAB): SARS Coronavirus 2: NEGATIVE

## 2019-12-08 LAB — LACTIC ACID, PLASMA: Lactic Acid, Venous: 1.3 mmol/L (ref 0.5–1.9)

## 2019-12-08 LAB — PROTIME-INR
INR: 1.3 — ABNORMAL HIGH (ref 0.8–1.2)
Prothrombin Time: 15.2 seconds (ref 11.4–15.2)

## 2019-12-08 MED ORDER — ACETAMINOPHEN 500 MG PO TABS
1000.0000 mg | ORAL_TABLET | Freq: Once | ORAL | Status: AC
  Administered 2019-12-08: 1000 mg via ORAL
  Filled 2019-12-08: qty 2

## 2019-12-08 NOTE — ED Notes (Signed)
covid spec to lab 

## 2019-12-08 NOTE — ED Triage Notes (Signed)
Pt c/o headache, fever, chills, body aches. Fever 102 in triage, unsure of any exposures, states that the symptoms started yesterday,

## 2019-12-09 MED ORDER — DOXYCYCLINE HYCLATE 100 MG PO CAPS: 100.0000 mg | ORAL_CAPSULE | Freq: Two times a day (BID) | ORAL | 0 refills | Status: DC

## 2019-12-09 MED ORDER — DOXYCYCLINE HYCLATE 100 MG PO TABS
100.0000 mg | ORAL_TABLET | Freq: Once | ORAL | Status: AC
  Administered 2019-12-09: 100 mg via ORAL
  Filled 2019-12-09: qty 1

## 2019-12-09 MED ORDER — HYDROCHLOROTHIAZIDE 25 MG PO TABS: 25.0000 mg | ORAL_TABLET | Freq: Every day | ORAL | 0 refills | Status: DC

## 2019-12-09 NOTE — Discharge Instructions (Addendum)
Drink plenty of fluids. Take the antibiotics until gone.  You can take acetaminophen 1000 mg plus ibuprofen 400 mg every 6 hours as needed for fever, body aches, or headache.  Recheck if you get worse.

## 2019-12-09 NOTE — ED Provider Notes (Signed)
[facility] EMERGENCY DEPARTMENT Provider Note   CSN: [id number] Arrival date & time: 12/08/19  2022   Time seen 11:50 PM  History Chief Complaint  Patient presents with  . Headache    Isaac Lopez is a 59 y.o. male.  HPI Patient states he started feeling bad when he woke up on June 19.  He states he had a headache which was frontal and on the top of his head.  He states it was pressure and throbbing like a migraine which she has had in the past but many years ago.  He states he went to work however about noon he started having chills and myalgias.  He states he came home from work and went to bed.  The following day he states he felt better after waking up at 2 AM drenched in sweat.  He states he feels like his fever had broken.  He still had a headache and he felt warm.  About 7 PM he started having chills again.  He states they lasted up to about an hour.  He denies cough, rhinorrhea, sore throat, nausea, vomiting, blurred vision, diarrhea, rash.  He states he has some pain on the right side of his neck.  He states he did find a tick on his right lower leg on June 9 and he states he brushed it off and it started bleeding where it had been attached.  Patient states he used to have a doctor at [facility] however his doctor left during the Covid outbreak and patient moved to [place] from [place] in March.  He states he has called around and there and no local doctors accepting patients.  He states he ran out of his blood pressure medicine in January.  At the time of my exam his blood pressure was 122/88.  Review of care everywhere shows he was on hydrochlorothiazide 25 mg a day.  PCP None    Past Medical History:  Diagnosis Date  . Chest pain   . Hyperlipemia   . Hypertension     There are no problems to display for this patient.   Past Surgical History:  Procedure Laterality Date  . BUNIONECTOMY         Family History  Problem Relation Age of Onset  .  Diabetes Mother   . Cancer Father   . Thyroid disease Sister   . Thyroid disease Other     Social History   Tobacco Use  . Smoking status: Former Games developer  . Smokeless tobacco: Never Used  Substance Use Topics  . Alcohol use: No  . Drug use: No    Home Medications Prior to Admission medications   Medication Sig Start Date End Date Taking? Authorizing Provider  amoxicillin-clavulanate (AUGMENTIN) 875-125 MG tablet Take 1 tablet by mouth 2 (two) times daily. One po bid x 7 days Patient not taking: Reported on 04/08/2017 11/10/16   Mancel Bale, MD  dicyclomine (BENTYL) 20 MG tablet Take 1 tablet (20 mg total) by mouth every 12 (twelve) hours as needed for spasms. For abdominal pain/cramping 04/08/17   Antony Madura, PA-C  doxycycline (VIBRAMYCIN) 100 MG capsule Take 1 capsule (100 mg total) by mouth 2 (two) times daily. 12/09/19   Devoria Albe, MD  hydrochlorothiazide (HYDRODIURIL) 25 MG tablet Take 1 tablet (25 mg total) by mouth daily. 12/09/19   Devoria Albe, MD  neomycin-polymyxin-hydrocortisone (CORTISPORIN) 3.5-10000-1 otic suspension Place 3 drops into the left ear 4 (four) times daily. X 7 days Patient not taking:  Reported on 04/08/2017 11/10/16   Daleen Bo, MD  Pseudoephedrine-APAP-DM (TYLENOL COLD/FLU SEVERE DAY PO) Take 2 tablets by mouth once.    [provider]  None  Allergies    Other  Review of Systems   Review of Systems  All other systems reviewed and are negative.   Physical Exam Updated Vital Signs BP (!) 136/100   Pulse 87   Temp 99.6 F (37.6 C) (Oral)   Resp 18   Ht 5\' 9"  (1.753 m)   Wt 99 kg   SpO2 100%   BMI 32.22 kg/m   Physical Exam Vitals and nursing note reviewed.  Constitutional:      Appearance: Normal appearance. He is obese.  HENT:     Head: Normocephalic and atraumatic.     Right Ear: External ear normal.     Left Ear: External ear normal.     Nose: Nose normal.     Mouth/Throat:     Mouth: Mucous membranes are moist.      Pharynx: No oropharyngeal exudate or posterior oropharyngeal erythema.  Eyes:     Extraocular Movements: Extraocular movements intact.     Conjunctiva/sclera: Conjunctivae normal.     Pupils: Pupils are equal, round, and reactive to light.  Cardiovascular:     Rate and Rhythm: Normal rate and regular rhythm.     Pulses: Normal pulses.     Heart sounds: No murmur heard.   Pulmonary:     Effort: Pulmonary effort is normal. No respiratory distress.     Breath sounds: Normal breath sounds.  Musculoskeletal:        General: No swelling, tenderness or deformity. Normal range of motion.     Cervical back: Normal range of motion and neck supple. No rigidity.  Skin:    General: Skin is warm and dry.     Findings: No rash.  Neurological:     General: No focal deficit present.     Mental Status: He is alert and oriented to person, place, and time.     Cranial Nerves: No cranial nerve deficit.  Psychiatric:        Mood and Affect: Mood normal.        Behavior: Behavior normal.        Thought Content: Thought content normal.     ED Results / Procedures / Treatments   Labs (all labs ordered are listed, but only abnormal results are displayed) Results for orders placed or performed during the hospital encounter of 12/08/19  Culture, blood (Routine x 2)   Specimen: Blood  Result Value Ref Range   Specimen Description LEFT ANTECUBITAL    Special Requests      BOTTLES DRAWN AEROBIC AND ANAEROBIC Blood Culture adequate volume Performed at [facility], [address], [place], [place] [address]    Culture PENDING    Report Status PENDING   Culture, blood (Routine x 2)   Specimen: Blood  Result Value Ref Range   Specimen Description RIGHT ANTECUBITAL    Special Requests      BOTTLES DRAWN AEROBIC AND ANAEROBIC Blood Culture adequate volume Performed at [facility], [address] [place], [place] [address]    Culture PENDING    Report Status PENDING   SARS Coronavirus 2 by RT PCR  (hospital order, performed in [place] hospital lab) Nasopharyngeal Nasopharyngeal Swab   Specimen: Nasopharyngeal Swab  Result Value Ref Range   SARS Coronavirus 2 NEGATIVE NEGATIVE  Comprehensive metabolic panel  Result Value Ref  Range   Sodium 136 135 - 145 mmol/L   Potassium 3.9 3.5 - 5.1 mmol/L   Chloride 103 98 - 111 mmol/L   CO2 24 22 - 32 mmol/L   Glucose, Bld 108 (H) 70 - 99 mg/dL   BUN 11 6 - 20 mg/dL   Creatinine, Ser 2.99 (H) 0.61 - 1.24 mg/dL   Calcium 9.0 8.9 - 37.1 mg/dL   Total Protein 7.4 6.5 - 8.1 g/dL   Albumin 3.9 3.5 - 5.0 g/dL   AST 85 (H) 15 - 41 U/L   ALT 64 (H) 0 - 44 U/L   Alkaline Phosphatase 102 38 - 126 U/L   Total Bilirubin 0.7 0.3 - 1.2 mg/dL   GFR calc non Af Amer 58 (L) >60 mL/min   GFR calc Af Amer >60 >60 mL/min   Anion gap 9 5 - 15  Lactic acid, plasma  Result Value Ref Range   Lactic Acid, Venous 1.3 0.5 - 1.9 mmol/L  CBC with Differential  Result Value Ref Range   WBC 2.3 (L) 4.0 - 10.5 K/uL   RBC 5.82 (H) 4.22 - 5.81 MIL/uL   Hemoglobin 14.5 13.0 - 17.0 g/dL   HCT 69.6 39 - 52 %   MCV 80.8 80.0 - 100.0 fL   MCH 24.9 (L) 26.0 - 34.0 pg   MCHC 30.9 30.0 - 36.0 g/dL   RDW 78.9 38.1 - 01.7 %   Platelets 99 (L) 150 - 400 K/uL   nRBC 0.0 0.0 - 0.2 %   Neutrophils Relative % 79 %   Neutro Abs 1.8 1.7 - 7.7 K/uL   Lymphocytes Relative 11 %   Lymphs Abs 0.3 (L) 0.7 - 4.0 K/uL   Monocytes Relative 10 %   Monocytes Absolute 0.2 0 - 1 K/uL   Eosinophils Relative 0 %   Eosinophils Absolute 0.0 0 - 0 K/uL   Basophils Relative 0 %   Basophils Absolute 0.0 0 - 0 K/uL   Immature Granulocytes 0 %   Abs Immature Granulocytes 0.01 0.00 - 0.07 K/uL  Protime-INR  Result Value Ref Range   Prothrombin Time 15.2 11.4 - 15.2 seconds   INR 1.3 (H) 0.8 - 1.2   Laboratory interpretation all normal except stable renal insufficiency, low total white blood cell count without neutropenia which she has had before, worsening of prior  thrombocytopenia    EKG None  Radiology No results found.  Procedures Procedures (including critical care time)  Medications Ordered in ED Medications  acetaminophen (TYLENOL) tablet 1,000 mg (1,000 mg Oral Given 12/08/19 2130)  doxycycline (VIBRA-TABS) tablet 100 mg (100 mg Oral Given 12/09/19 0017)    ED Course  I have reviewed the triage vital signs and the nursing notes.  Pertinent labs & imaging results that were available during my care of the patient were reviewed by me and considered in my medical decision making (see chart for details).    MDM Rules/Calculators/A&P                          Patient states his headaches better with the Tylenol that was given to him in triage for his fever when he arrived.  He did not feel like he needed any additional medication.  We discussed his prior thrombocytopenia.  When I review his charts from October 2018 through June 17, 2011 he has had a thrombocytopenia ranging from 127,000-135,000.  Tonight is much lower.  He states he has  never been evaluated by hematologist.  He has also had a total white blood cell count being low before, it was 3.0 in June 2015 and 3.6 in October 2018.  Patient's Covid test is negative.  His low total white blood cell count could be indicative of a viral illness however with the worsening of his thrombocytopenia and him having had a tick on his leg in the past 10 days I feel like it would be prudent to go ahead and treat him for a tickborne illness.  Patient complains of neck pain but it is more muscular on the right side of his neck and not nuchal rigidity.  He moves his head freely during the course of conversation.  He was started on doxycycline.  We also discussed options for trying to find a primary care doctor.     Final Clinical Impression(s) / ED Diagnoses Final diagnoses:  Fever, unspecified fever cause  Essential hypertension  Thrombocytopenia (HCC)  At high risk for tick borne illness     Rx / DC Orders ED Discharge Orders         Ordered    doxycycline (VIBRAMYCIN) 100 MG capsule  2 times daily     Discontinue     12/09/19 0021    hydrochlorothiazide (HYDRODIURIL) 25 MG tablet  Daily     Discontinue     12/09/19 0021          Plan discharge  Devoria Albe, MD, Concha Pyo, MD 12/09/19 [phone]

## 2019-12-13 LAB — CULTURE, BLOOD (ROUTINE X 2)
Culture: NO GROWTH
Culture: NO GROWTH
Special Requests: ADEQUATE
Special Requests: ADEQUATE

## 2020-10-26 DIAGNOSIS — H60549 Acute eczematoid otitis externa, unspecified ear: Secondary | ICD-10-CM | POA: Insufficient documentation

## 2020-10-26 DIAGNOSIS — D696 Thrombocytopenia, unspecified: Secondary | ICD-10-CM | POA: Insufficient documentation

## 2020-10-26 DIAGNOSIS — M5030 Other cervical disc degeneration, unspecified cervical region: Secondary | ICD-10-CM | POA: Insufficient documentation

## 2020-10-26 DIAGNOSIS — N529 Male erectile dysfunction, unspecified: Secondary | ICD-10-CM | POA: Insufficient documentation

## 2020-10-26 DIAGNOSIS — I10 Essential (primary) hypertension: Secondary | ICD-10-CM | POA: Insufficient documentation

## 2022-01-25 ENCOUNTER — Emergency Department (HOSPITAL_COMMUNITY)
Admission: EM | Admit: 2022-01-25 | Discharge: 2022-01-26 | Disposition: A | Discharge status: Home / Self Care | Payer: Self-pay | Attending: Student | Admitting: Student

## 2022-01-25 DIAGNOSIS — I10 Essential (primary) hypertension: Secondary | ICD-10-CM | POA: Insufficient documentation

## 2022-01-25 DIAGNOSIS — Z79899 Other long term (current) drug therapy: Secondary | ICD-10-CM | POA: Insufficient documentation

## 2022-01-25 DIAGNOSIS — M25561 Pain in right knee: Secondary | ICD-10-CM | POA: Insufficient documentation

## 2022-01-25 DIAGNOSIS — M25569 Pain in unspecified knee: Secondary | ICD-10-CM

## 2022-01-25 DIAGNOSIS — Z87891 Personal history of nicotine dependence: Secondary | ICD-10-CM | POA: Insufficient documentation

## 2022-01-26 ENCOUNTER — Other Ambulatory Visit: Payer: Self-pay

## 2022-01-26 ENCOUNTER — Encounter (HOSPITAL_COMMUNITY): Payer: Self-pay | Admitting: Emergency Medicine

## 2022-01-26 ENCOUNTER — Emergency Department (HOSPITAL_COMMUNITY): Payer: Self-pay

## 2022-01-26 LAB — D-DIMER, QUANTITATIVE: D-Dimer, Quant: <0.27 ug/mL-FEU (ref 0.00–0.50)

## 2022-01-26 MED ORDER — KETOROLAC TROMETHAMINE 15 MG/ML IJ SOLN
15.0000 mg | Freq: Once | INTRAMUSCULAR | Status: AC
  Administered 2022-01-26: 15 mg via INTRAMUSCULAR
  Filled 2022-01-26: qty 1

## 2022-01-26 MED ORDER — NAPROXEN 375 MG PO TABS: 375.0000 mg | ORAL_TABLET | Freq: Two times a day (BID) | ORAL | 0 refills | Status: DC

## 2022-01-26 NOTE — ED Triage Notes (Signed)
Pt c/o right knee pain since Saturday.  

## 2022-01-29 DIAGNOSIS — M25461 Effusion, right knee: Secondary | ICD-10-CM | POA: Diagnosis not present

## 2022-01-29 DIAGNOSIS — M25561 Pain in right knee: Secondary | ICD-10-CM | POA: Diagnosis not present

## 2022-02-01 NOTE — ED Provider Notes (Signed)
[facility] EMERGENCY DEPARTMENT Provider Note  CSN: [id number] Arrival date & time: 01/25/22 2352  Chief Complaint(s) Knee Pain  HPI Isaac Lopez is a 61 y.o. male with PMH HTN, HLD who presents emergency department for evaluation of right knee pain patient states that his pain began on 01/22/2022 and has been gradually worsening.  No specific trauma to the knee.  He states he notices a little bit of swelling to the knee and is concerned he might have a blood clot.  Denies chest pain, shortness of breath, abdominal pain, nausea, vomiting, fever or other systemic symptoms.   Past Medical History Past Medical History:  Diagnosis Date   Chest pain    Hyperlipemia    Hypertension    There are no problems to display for this patient.  Home Medication(s) Prior to Admission medications   Medication Sig Start Date End Date Taking? Authorizing Provider  naproxen (NAPROSYN) 375 MG tablet Take 1 tablet (375 mg total) by mouth 2 (two) times daily. 01/26/22  Yes Samari Bittinger, MD  amoxicillin-clavulanate (AUGMENTIN) 875-125 MG tablet Take 1 tablet by mouth 2 (two) times daily. One po bid x 7 days Patient not taking: Reported on 04/08/2017 11/10/16   Mancel Bale, MD  dicyclomine (BENTYL) 20 MG tablet Take 1 tablet (20 mg total) by mouth every 12 (twelve) hours as needed for spasms. For abdominal pain/cramping 04/08/17   Antony Madura, PA-C  doxycycline (VIBRAMYCIN) 100 MG capsule Take 1 capsule (100 mg total) by mouth 2 (two) times daily. 12/09/19   Devoria Albe, MD  hydrochlorothiazide (HYDRODIURIL) 25 MG tablet Take 1 tablet (25 mg total) by mouth daily. 12/09/19   Devoria Albe, MD  neomycin-polymyxin-hydrocortisone (CORTISPORIN) 3.5-10000-1 otic suspension Place 3 drops into the left ear 4 (four) times daily. X 7 days Patient not taking: Reported on 04/08/2017 11/10/16   Mancel Bale, MD  Pseudoephedrine-APAP-DM (TYLENOL COLD/FLU SEVERE DAY PO) Take 2 tablets by mouth once.    [provider]                                                                                                                                     Past Surgical History Past Surgical History:  Procedure Laterality Date   BUNIONECTOMY     Family History Family History  Problem Relation Age of Onset   Diabetes Mother    Cancer Father    Thyroid disease Sister    Thyroid disease Other     Social History Social History   Tobacco Use   Smoking status: Former   Smokeless tobacco: Never  Substance Use Topics   Alcohol use: No   Drug use: No   Allergies Other  Review of Systems Review of Systems  Musculoskeletal:  Positive for arthralgias.    Physical Exam Vital Signs  I have reviewed the triage vital signs BP (!) 144/98   Pulse 78   Temp 98 F (36.7 C)  Resp 17   Ht 5\' 9"  (1.753 m)   Wt 121.6 kg   SpO2 95%   BMI 39.58 kg/m   Physical Exam Constitutional:      General: He is not in acute distress.    Appearance: Normal appearance.  HENT:     Head: Normocephalic and atraumatic.     Nose: No congestion or rhinorrhea.  Eyes:     General:        Right eye: No discharge.        Left eye: No discharge.     Extraocular Movements: Extraocular movements intact.     Pupils: Pupils are equal, round, and reactive to light.  Cardiovascular:     Rate and Rhythm: Normal rate and regular rhythm.     Heart sounds: No murmur heard. Pulmonary:     Effort: No respiratory distress.     Breath sounds: No wheezing or rales.  Abdominal:     General: There is no distension.     Tenderness: There is no abdominal tenderness.  Musculoskeletal:        General: Swelling and tenderness present. Normal range of motion.     Cervical back: Normal range of motion.  Skin:    General: Skin is warm and dry.  Neurological:     General: No focal deficit present.     Mental Status: He is alert.     ED Results and Treatments Labs (all labs ordered are listed, but only abnormal results are  displayed) Labs Reviewed  D-DIMER, QUANTITATIVE                                                                                                                          Radiology No results found.  Pertinent labs & imaging results that were available during my care of the patient were reviewed by me and considered in my medical decision making (see MDM for details).  Medications Ordered in ED Medications  ketorolac (TORADOL) 15 MG/ML injection 15 mg (15 mg Intramuscular Given 01/26/22 0205)                                                                                                                                     Procedures Procedures  (including critical care time)  Medical Decision Making / ED Course   This patient presents to the ED for concern of knee pain, this involves  an extensive number of treatment options, and is a complaint that carries with it a high risk of complications and morbidity.  The differential diagnosis includes osteoarthritis, DVT, septic joint, gout  MDM: Patient seen emergency department for evaluation of knee pain.  Physical exam with a very mild amount of swelling and minimal tenderness to the right knee.  No appreciable erythema and patient does have full range of motion of this joint.  X-ray showing mild tricompartmental osteoarthritis.  Patient received IM Toradol and his pain improved significantly.  Due to patient's concern about a blood clot and my inability to obtain a ultrasound this late at night, we performed a D-dimer that was reassuringly negative which significant decreases chance of having an underlying DVT.  Suspect osteoarthritis.  Patient then discharged with outpatient follow-up.   Additional history obtained:  -External records from outside source obtained and reviewed including: Chart review including previous notes, labs, imaging, consultation notes   Lab Tests: -I ordered, reviewed, and interpreted labs.   The pertinent  results include:   Labs Reviewed  D-DIMER, QUANTITATIVE    Imaging Studies ordered: I ordered imaging studies including XR knee I independently visualized and interpreted imaging. I agree with the radiologist interpretation   Medicines ordered and prescription drug management: Meds ordered this encounter  Medications   ketorolac (TORADOL) 15 MG/ML injection 15 mg   naproxen (NAPROSYN) 375 MG tablet    Sig: Take 1 tablet (375 mg total) by mouth 2 (two) times daily.    Dispense:  20 tablet    Refill:  0    -I have reviewed the patients home medicines and have made adjustments as needed  Critical interventions none  Cardiac Monitoring: The patient was maintained on a cardiac monitor.  I personally viewed and interpreted the cardiac monitored which showed an underlying rhythm of: NSR  Social Determinants of Health:  Factors impacting patients care include: none   Reevaluation: After the interventions noted above, I reevaluated the patient and found that they have :improved  Co morbidities that complicate the patient evaluation  Past Medical History:  Diagnosis Date   Chest pain    Hyperlipemia    Hypertension       Dispostion: I considered admission for this patient, but he does not meet inpatient criteria for admission and safe for discharge and outpatient follow-up     Final Clinical Impression(s) / ED Diagnoses Final diagnoses:  Acute knee pain, unspecified laterality     @PCDICTATION @    , MD 02/01/22 [phone]

## 2022-06-09 ENCOUNTER — Telehealth: Payer: Self-pay | Admitting: Orthopedic Surgery

## 2022-06-09 ENCOUNTER — Ambulatory Visit (INDEPENDENT_AMBULATORY_CARE_PROVIDER_SITE_OTHER): Payer: Self-pay | Admitting: Orthopedic Surgery

## 2022-06-09 ENCOUNTER — Encounter: Payer: Self-pay | Admitting: Orthopedic Surgery

## 2022-06-09 VITALS — BP 148/88 | HR 74 | Ht 69.0 in | Wt 208.4 lb

## 2022-06-09 DIAGNOSIS — M1711 Unilateral primary osteoarthritis, right knee: Secondary | ICD-10-CM

## 2022-06-09 DIAGNOSIS — M25461 Effusion, right knee: Secondary | ICD-10-CM | POA: Diagnosis not present

## 2022-06-09 MED ORDER — METHYLPREDNISOLONE ACETATE 40 MG/ML IJ SUSP
40.0000 mg | Freq: Once | INTRAMUSCULAR | Status: AC
  Administered 2022-06-09: 40 mg via INTRA_ARTICULAR

## 2022-06-09 MED ORDER — PREDNISONE 10 MG PO TABS: 10.0000 mg | ORAL_TABLET | Freq: Three times a day (TID) | ORAL | 0 refills | Status: DC

## 2022-06-09 NOTE — Telephone Encounter (Signed)
Patient called, lvm, was seen today.  He lvm for Amy regarding the pharmacy he uses.  He uses the pharmacy at the [facility] [phone]

## 2022-06-09 NOTE — Addendum Note (Signed)
Addended byCaffie Damme on: 06/09/2022 12:09 PM   Modules accepted: Orders

## 2022-06-09 NOTE — Progress Notes (Addendum)
Patient ID: Isaac Lopez, male   DOB: Apr 07, 1961, 61 y.o.   MRN: [id number]  SUMMARY  AND PLAN :  Encounter Diagnoses  Name Primary?   Effusion, right knee Yes   Primary osteoarthritis of right knee     Mr. Mindel has an amatory arthritis could be from osteoarthritis or torn meniscus.  He has failed naproxen treatment has had an aspiration reaspirated the knee injected with cortisone put him on prednisone 10 mg 3 times a day and he will ice the knee daily I will call him after I get the results of his fluid analysis and I referred him to a rheumatologist  Chief Complaint  Patient presents with   Knee Pain    Rt knee pain since end of Jun '23. Pt states he did a lot of driving during that time but no known injury.      HPI Isaac Lopez is a 61 y.o. male.  Presents with pain swelling right knee after driving to [place] in [place].  This started sometime in June.  Has been to the emergency room at [facility] as well as [facility].  At [facility] he had an aspiration that showed mainly blood  RBCs 32,000 synovial fluid nucleated cells 289 no evidence of gout or pseudogout neutrophils 18%  His sed rate white count and C-reactive protein were also taken.  C-reactive protein 10 sed rate 36 white count 4.  He did have some thrombocytopenia in the lab analysis as well  He says years ago he had an effusion he was sent to [facility] clinic to rule out inflammatory arthritis and although his doctor thought it was inflammatory arthritis do clinic at that time said that it was not  Current Outpatient Medications  Medication Instructions   losartan (COZAAR) 50 MG tablet Oral, Daily   UNKNOWN TO PATIENT Patient takes a cholesterol med does not know name      Allergies  Allergen Reactions   Other     Dust mites, ragweed, cigarette smoke, cockroach     Review of Systems SEE HPI Back and joint pain   Past Medical History:  Diagnosis Date   Chest pain    Hyperlipemia     Hypertension     Past Surgical History:  Procedure Laterality Date   BUNIONECTOMY      Family History  Problem Relation Age of Onset   Diabetes Mother    Cancer Father    Thyroid disease Sister    Thyroid disease Other      Social History   Tobacco Use   Smoking status: Former   Smokeless tobacco: Never  Substance Use Topics   Alcohol use: No   Drug use: No    Allergies  Allergen Reactions   Other     Dust mites, ragweed, cigarette smoke, cockroach    Current Outpatient Medications  Medication Sig Dispense Refill   losartan (COZAAR) 50 MG tablet Take by mouth daily.     UNKNOWN TO PATIENT Patient takes a cholesterol med does not know name     No current facility-administered medications for this visit.       Physical Exam BP (!) 148/88   Pulse 74   Ht 5\' 9"  (1.753 m)   Wt 208 lb 6.4 oz (94.5 kg)   BMI 30.78 kg/m  Body mass index is 30.78 kg/m.   Appearance, there are no abnormalities in terms of appearance the patient was well-developed and well-nourished. The grooming and hygiene were normal.  Mental status orientation, there was normal alertness and orientation Mood pleasant Ambulatory status unsupported gait no assistive devices no significant limp  Right KNEE: SKIN LOOKS GOOD TENDERNESS medial femoral condyle on the bone no joint line tenderness SWELLING large effusion ROM = full extension 120 degrees flexion LIGAMENT EXAM normal MOTOR EXAM normal      MEDICAL DECISION SECTION  xrays ordered?  No new images images were in epic  My independent reading of xrays: X-rays from the outside facility include 4 views of the knee there is an effusion there is mild arthritis all 3 compartments  Outside records referral from Orlena Sheldon RN that [facility] and [facility] urgent referral request her notes indicate the lab results from [facility] the clinical course with the 2 ER visits.  Medical history also included including  medications.  Notes from [facility] dated 01/29/2022 indicate the aspiration the RBCs and the fluid in the synovial cell nucleated cells in the fluid as well as his x-ray report   Encounter Diagnoses  Name Primary?   Effusion, right knee Yes   Primary osteoarthritis of right knee      PLAN:   Aspiration injection  Procedure note injection and aspiration right knee joint  Verbal consent was obtained to aspirate and inject the right knee joint   Timeout was completed to confirm the site of aspiration and injection  An 18-gauge needle was used to aspirate the knee joint from a suprapatellar lateral approach.  The medications used were 40 mg of Depo-Medrol and 1% lidocaine 3 cc  Anesthesia was provided by ethyl chloride and the skin was prepped with alcohol.  After cleaning the skin with alcohol an 18-gauge needle was used to aspirate the right knee joint.  We obtained 60  cc of fluid clear  We follow this by injection of 40 mg of Depo-Medrol and 3 cc 1% lidocaine.  There were no complications. A sterile bandage was applied.   Prednisone  Meds ordered this encounter  Medications   predniSONE (DELTASONE) 10 MG tablet    Sig: Take 1 tablet (10 mg total) by mouth 3 (three) times daily.    Dispense:  90 tablet    Refill:  0     Ice, Ace wrap for a few days  Fluid sent for Gram stain and culture cell count crystal analysis Results will be called to him   Referral to rheumatology  No orders of the defined types were placed in this encounter.

## 2022-06-09 NOTE — Telephone Encounter (Signed)
I can't put in the health department for his pharmacy Can you help me ?  A little frustrating

## 2022-06-09 NOTE — Patient Instructions (Addendum)
Please see Rheumatolgy either at [facility] or with our [facility] group   Ice daily   Take prednisone 3 x a day   Wear ace wrap 2 days    You have received an injection of steroids into the joint. 15% of patients will have increased pain within the 24 hours postinjection.   This is transient and will go away.   We recommend that you use ice packs on the injection site for 20 minutes every 2 hours and extra strength Tylenol 2 tablets every 8 as needed until the pain resolves.  If you continue to have pain after taking the Tylenol and using the ice please call the office for further instructions.

## 2022-06-14 LAB — SYNOVIAL FLUID ANALYSIS, COMPLETE
Basophils, %: 0 %
Eosinophils-Synovial: 0 % (ref 0–2)
Lymphocytes-Synovial Fld: 80 % — ABNORMAL HIGH (ref 0–74)
Monocyte/Macrophage: 20 % (ref 0–69)
Neutrophil, Synovial: 0 % (ref 0–24)
Synoviocytes, %: 0 % (ref 0–15)
WBC, Synovial: 374 cells/uL — ABNORMAL HIGH (ref <150)

## 2022-06-14 LAB — WOUND CULTURE
GRAM STAIN:: NONE SEEN
MICRO NUMBER:: 14347669
RESULT:: NO GROWTH
SPECIMEN QUALITY:: ADEQUATE

## 2022-06-17 NOTE — Telephone Encounter (Signed)
Isaac Lopez had his labs done he has no evidence of infection he does have inflammatory arthritis there is no evidence of gout or pseudogout I relayed the results to him  He will continue his prednisone for 30 days starting from December 21  He says his knee is doing well with minimal discomfort and no swelling  The plan is for him to complete the medication for 30 days and then do a 1 week trial off the medicine if the fluid comes back he is to call the office to get a refill on an anti-inflammatory he is currently on prednisone I would like to start something different such as meloxicam or diclofenac if needed

## 2022-07-07 ENCOUNTER — Encounter: Payer: Self-pay | Admitting: *Deleted

## 2022-07-09 ENCOUNTER — Emergency Department (HOSPITAL_COMMUNITY): Payer: 59

## 2022-07-09 ENCOUNTER — Encounter (HOSPITAL_COMMUNITY): Payer: Self-pay | Admitting: Emergency Medicine

## 2022-07-09 ENCOUNTER — Emergency Department (HOSPITAL_COMMUNITY)
Admission: EM | Admit: 2022-07-09 | Discharge: 2022-07-09 | Disposition: A | Discharge status: Home / Self Care | Payer: 59 | Attending: Emergency Medicine | Admitting: Emergency Medicine

## 2022-07-09 ENCOUNTER — Other Ambulatory Visit: Payer: Self-pay

## 2022-07-09 DIAGNOSIS — J101 Influenza due to other identified influenza virus with other respiratory manifestations: Secondary | ICD-10-CM | POA: Diagnosis not present

## 2022-07-09 DIAGNOSIS — Z79899 Other long term (current) drug therapy: Secondary | ICD-10-CM | POA: Insufficient documentation

## 2022-07-09 DIAGNOSIS — I10 Essential (primary) hypertension: Secondary | ICD-10-CM | POA: Diagnosis not present

## 2022-07-09 DIAGNOSIS — R059 Cough, unspecified: Secondary | ICD-10-CM | POA: Diagnosis not present

## 2022-07-09 DIAGNOSIS — Z1152 Encounter for screening for COVID-19: Secondary | ICD-10-CM | POA: Insufficient documentation

## 2022-07-09 LAB — RESP PANEL BY RT-PCR (RSV, FLU A&B, COVID)  RVPGX2
Influenza A by PCR: POSITIVE — AB
Influenza B by PCR: NEGATIVE
Resp Syncytial Virus by PCR: NEGATIVE
SARS Coronavirus 2 by RT PCR: NEGATIVE

## 2022-07-09 MED ORDER — ALBUTEROL SULFATE HFA 108 (90 BASE) MCG/ACT IN AERS
2.0000 | INHALATION_SPRAY | RESPIRATORY_TRACT | Status: DC | PRN
  Administered 2022-07-09: 2 via RESPIRATORY_TRACT
  Filled 2022-07-09: qty 6.7

## 2022-07-09 MED ORDER — ALBUTEROL SULFATE (2.5 MG/3ML) 0.083% IN NEBU
INHALATION_SOLUTION | RESPIRATORY_TRACT | Status: AC
  Administered 2022-07-09: 2.5 mg
  Filled 2022-07-09: qty 3

## 2022-07-09 MED ORDER — OSELTAMIVIR PHOSPHATE 75 MG PO CAPS: 75.0000 mg | ORAL_CAPSULE | Freq: Two times a day (BID) | ORAL | 0 refills | Status: DC

## 2022-07-09 MED ORDER — ALBUTEROL SULFATE HFA 108 (90 BASE) MCG/ACT IN AERS
2.0000 | INHALATION_SPRAY | RESPIRATORY_TRACT | 0 refills | Status: DC | PRN
Start: 2022-07-09 — End: 2022-07-25

## 2022-07-09 MED ORDER — IPRATROPIUM-ALBUTEROL 0.5-2.5 (3) MG/3ML IN SOLN
3.0000 mL | Freq: Once | RESPIRATORY_TRACT | Status: AC
Start: 2022-07-09 — End: 2022-07-09
  Administered 2022-07-09: 3 mL via RESPIRATORY_TRACT
  Filled 2022-07-09: qty 3

## 2022-07-09 MED ORDER — PREDNISONE 50 MG PO TABS: 50.0000 mg | ORAL_TABLET | Freq: Every day | ORAL | 0 refills | Status: DC

## 2022-07-09 MED ORDER — OSELTAMIVIR PHOSPHATE 75 MG PO CAPS
75.0000 mg | ORAL_CAPSULE | Freq: Once | ORAL | Status: AC
  Administered 2022-07-09: 75 mg via ORAL
  Filled 2022-07-09: qty 1

## 2022-07-09 MED ORDER — PREDNISONE 50 MG PO TABS
60.0000 mg | ORAL_TABLET | Freq: Once | ORAL | Status: AC
  Administered 2022-07-09: 60 mg via ORAL
  Filled 2022-07-09: qty 1

## 2022-07-09 NOTE — ED Triage Notes (Signed)
Pt with non-productive cough and congestion x 2 days.

## 2022-07-09 NOTE — Discharge Instructions (Addendum)
Drink plenty fluids.  Take acetaminophen and/your ibuprofen as needed for fever or aching.  Use the inhaler-2 puffs at a time every 4 hours, as needed for shortness of breath or coughing.

## 2022-07-09 NOTE — ED Provider Notes (Signed)
[facility] Provider Note   CSN: [id number] Arrival date & time: 07/09/22  0053     History  Chief Complaint  Patient presents with   Cough   Nasal Congestion    Isaac Lopez is a 62 y.o. male.  The history is provided by the patient.  Cough He has history of hypertension, hyperlipidemia and comes in with nonproductive cough which started yesterday.  There is associated dyspnea.  He thinks he may have run a fever but did not check his temperature.  He denies chills or sweats.  He is complaining of soreness in his chest from coughing.  He denies any other body aches.  He denies any nausea, vomiting, diarrhea.  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  He did take a dose of Robitussin DM without any relief.   Home Medications Prior to Admission medications   Medication Sig Start Date End Date Taking? Authorizing Provider  losartan (COZAAR) 50 MG tablet Take by mouth daily.    [provider]  predniSONE (DELTASONE) 10 MG tablet Take 1 tablet (10 mg total) by mouth 3 (three) times daily. 06/09/22   Carole Civil, MD  UNKNOWN TO PATIENT Patient takes a cholesterol med does not know name    [provider]      Allergies    Other    Review of Systems   Review of Systems  Respiratory:  Positive for cough.   All other systems reviewed and are negative.   Physical Exam Updated Vital Signs BP (!) 163/119 (BP Location: Left Arm)   Pulse 88   Temp 98.5 F (36.9 C) (Oral)   Resp 20   Ht 5\' 9"  (1.753 m)   Wt 92.5 kg   SpO2 95%   BMI 30.13 kg/m  Physical Exam Vitals and nursing note reviewed.   62 year old male, resting comfortably and in no acute distress. Vital signs are significant for elevated blood pressure. Oxygen saturation is 95%, which is normal. Head is normocephalic and atraumatic. PERRLA, EOMI. Oropharynx is clear. Neck is  nontender and supple without adenopathy or JVD. Back is nontender and there is no CVA tenderness. Lungs have a slightly prolonged exhalation phase with slight wheezing noted on forced exhalation.  There are no rales or rhonchi. Chest is nontender. Heart has regular rate and rhythm without murmur. Abdomen is soft, flat, nontender. Extremities have no cyanosis or edema, full range of motion is present. Skin is warm and dry without rash. Neurologic: Mental status is normal, cranial nerves are intact, moves all extremities equally.  ED Results / Procedures / Treatments   Labs (all labs ordered are listed, but only abnormal results are displayed) Labs Reviewed  RESP PANEL BY RT-PCR (RSV, FLU A&B, COVID)  RVPGX2 - Abnormal; Notable for the following components:      Result Value   Influenza A by PCR POSITIVE (*)    All other components within normal limits    EKG EKG Interpretation  Date/Time:  Saturday July 09 2022 01:02:43 EST Ventricular Rate:  86 PR Interval:  157 QRS Duration: 74 QT Interval:  344 QTC Calculation: 412 R Axis:   54 Text Interpretation: Sinus rhythm Abnormal R-wave progression, early transition Nonspecific T abnormalities, lateral leads Minimal ST elevation, inferior leads Baseline wander in lead(s) V1 V4 Atrial escape complexes 03/08/2014, T wave abnormality has improved Confirmed by  Delora Fuel ([id number]) on 07/09/2022 1:06:57 AM  Radiology DG Chest Portable 1 View  Result Date: 07/09/2022 CLINICAL DATA:  Cough and congestion. EXAM: PORTABLE CHEST 1 VIEW COMPARISON:  12/16/2013. FINDINGS: The heart size and mediastinal contours are within normal limits. Both lungs are clear. No acute osseous abnormality. IMPRESSION: No active disease. Electronically Signed   By: Brett Fairy M.D.   On: 07/09/2022 01:14    Procedures Procedures  Cardiac monitor shows normal sinus rhythm, per my interpretation.  Medications Ordered in ED Medications  predniSONE (DELTASONE)  tablet 60 mg (has no administration in time range)  oseltamivir (TAMIFLU) capsule 75 mg (has no administration in time range)  albuterol (VENTOLIN HFA) 108 (90 Base) MCG/ACT inhaler 2 puff (has no administration in time range)  ipratropium-albuterol (DUONEB) 0.5-2.5 (3) MG/3ML nebulizer solution 3 mL (3 mLs Nebulization Given 07/09/22 0123)  albuterol (PROVENTIL) (2.5 MG/3ML) 0.083% nebulizer solution (2.5 mg  Given 07/09/22 0126)    ED Course/ Medical Decision Making/ A&P                             Medical Decision Making Amount and/or Complexity of Data Reviewed Radiology: ordered.  Risk Prescription drug management.   Respiratory tract infection.  Consider pneumonia, bronchitis, influenza, RSV, COVID-19, other viral infections.  Chest x-ray shows no evidence of pneumonia.  I have independently viewed the image, and agree with radiologist's interpretation.  I have reviewed and interpreted his electrocardiogram and my interpretation is nonspecific T wave changes which are actually improved compared with most recent ECG in 2015.  I have ordered a respiratory pathogen panel, results pending.  I have ordered a nebulizer treatment with albuterol and ipratropium.  I have reviewed and interpreted the laboratory test, and my interpretation is positive PCR for influenza A, which is the cause of his symptoms.  He noted significant subjective improvement with nebulizer treatment with albuterol and ipratropium.  I have ordered a dose of prednisone.  I have discussed the risks and benefits of antiviral treatment and patient, through shared decision making, has elected to receive the antiviral medication.  I have ordered a dose of oseltamivir.  I am discharging him with prescriptions for prednisone and oseltamavir and I am giving him an albuterol inhaler to take home to use every 4 hours as needed.  Final Clinical Impression(s) / ED Diagnoses Final diagnoses:  Influenza A  Elevated blood pressure  reading with diagnosis of hypertension    Rx / DC Orders ED Discharge Orders          Ordered    predniSONE (DELTASONE) 50 MG tablet  Daily        07/09/22 0219    albuterol (VENTOLIN HFA) 108 (90 Base) MCG/ACT inhaler  Every 4 hours PRN        07/09/22 0219    oseltamivir (TAMIFLU) 75 MG capsule  Every 12 hours        07/09/22 1517              Delora Fuel, MD 61/60/73 0222

## 2022-07-12 ENCOUNTER — Telehealth: Payer: Self-pay

## 2022-07-25 ENCOUNTER — Telehealth (INDEPENDENT_AMBULATORY_CARE_PROVIDER_SITE_OTHER): Payer: Self-pay | Admitting: *Deleted

## 2022-07-25 NOTE — Telephone Encounter (Signed)
  Procedure: Colonoscopy  Estimated body mass index is 30.13 kg/m as calculated from the following:   Height as of 07/09/22: 5\' 9"  (1.753 m).   Weight as of 07/09/22: 204 lb (92.5 kg).   Have you had a colonoscopy before?  Yes but not sure when  Do you have family history of colon cancer?  Yes grandfather  Do you have a family history of polyps? yes  Previous colonoscopy with polyps removed? no  Do you have a history colorectal cancer?   no  Are you diabetic?  no  Do you have a prosthetic or mechanical heart valve? no  Do you have a pacemaker/defibrillator?   no  Have you had endocarditis/atrial fibrillation?  no  Do you use supplemental oxygen/CPAP?  no  Have you had joint replacement within the last 12 months?  no  Do you tend to be constipated or have to use laxatives?  no   Do you have history of alcohol use? If yes, how much and how often.  no  Do you have history or are you using drugs? If yes, what do are you  using?  no  Have you ever had a stroke/heart attack?  no  Have you ever had a heart or other vascular stent placed,?no  Do you take weight loss medication? no  Do you take any blood-thinning medications such as: (Plavix, aspirin, Coumadin, Aggrenox, Brilinta, Xarelto, Eliquis, Pradaxa, Savaysa or Effient)? aspirin  If yes we need the name, milligram, dosage and who is prescribing doctor:               Current Outpatient Medications  Medication Sig Dispense Refill   aspirin EC 81 MG tablet Take 81 mg by mouth daily. Swallow whole.     fenofibrate (TRICOR) 145 MG tablet Take 145 mg by mouth daily.     losartan (COZAAR) 50 MG tablet Take by mouth daily.     No current facility-administered medications for this visit.    Allergies  Allergen Reactions   Other     Dust mites, ragweed, cigarette smoke, cockroach

## 2022-08-01 NOTE — Telephone Encounter (Signed)
Completed a hospital f/u call as a result of the pt being seen for influenza.  Successully spoke with patient and he states he has no current medical needs as it relates medications or any additional medical concerns since being released from hospital ed on 1.20.24.   States he has improved with his symptoms and has taken his medication as instructed by medical provider of the ED at [facility].    Pt continues to be connected with his PCP at the [facility] (next appt is 09/15/22)in addition to his specialist that he has on file (see [facility] section for next appts with specialist upcoming)  Pt was re-educated on the use of Urgent vs Emergency room in the event his PCP office is not available or open for business.    Urgent vs emergency conditions were also reviewed with the patient.   Pt continues to be connected with his PCP at the [facility] in addition to his specialist that he has on file (see [facility] section for next appts with specialist)   Pt states he understood and  call ended

## 2022-08-04 NOTE — Telephone Encounter (Signed)
Ok to schedule. ASA 2.  

## 2022-08-05 ENCOUNTER — Encounter: Payer: Self-pay | Admitting: *Deleted

## 2022-08-05 MED ORDER — PEG 3350-KCL-NA BICARB-NACL 420 G PO SOLR: 4000.0000 mL | Freq: Once | ORAL | 0 refills | Status: AC

## 2022-08-05 NOTE — Addendum Note (Signed)
Addended by: Cheron Every on: 08/05/2022 10:11 AM   Modules accepted: Orders

## 2022-08-05 NOTE — Telephone Encounter (Signed)
Referral completed

## 2022-08-05 NOTE — Telephone Encounter (Signed)
Spoke w/ pt. Scheduled for 3/25 at 815am. Aware will send instructions. Rx for prep sent to pharmacy

## 2022-08-18 ENCOUNTER — Encounter: Payer: Self-pay | Admitting: Radiology

## 2022-09-06 ENCOUNTER — Telehealth: Payer: Self-pay | Admitting: *Deleted

## 2022-09-06 NOTE — Telephone Encounter (Signed)
Pt called to cancel his procedure for 09/12/22. He says he can't do it at this time and will call back to reschedule. Endo notified. FYI

## 2022-09-12 ENCOUNTER — Ambulatory Visit (HOSPITAL_COMMUNITY): Admission: RE | Admit: 2022-09-12 | Payer: Self-pay | Source: Home / Self Care | Admitting: Internal Medicine

## 2022-09-12 ENCOUNTER — Encounter (HOSPITAL_COMMUNITY): Admission: RE | Payer: Self-pay | Source: Home / Self Care

## 2022-09-12 SURGERY — COLONOSCOPY WITH PROPOFOL
Anesthesia: Monitor Anesthesia Care

## 2022-11-23 ENCOUNTER — Other Ambulatory Visit (HOSPITAL_COMMUNITY): Payer: Self-pay | Admitting: *Deleted

## 2022-11-23 ENCOUNTER — Ambulatory Visit (HOSPITAL_COMMUNITY)
Admission: RE | Admit: 2022-11-23 | Discharge: 2022-11-23 | Disposition: A | Discharge status: Home / Self Care | Payer: Medicaid Other | Source: Ambulatory Visit | Attending: *Deleted | Admitting: *Deleted

## 2022-11-23 DIAGNOSIS — R058 Other specified cough: Secondary | ICD-10-CM | POA: Diagnosis present

## 2022-11-27 ENCOUNTER — Emergency Department (HOSPITAL_COMMUNITY): Payer: Medicaid Other

## 2022-11-27 ENCOUNTER — Encounter (HOSPITAL_COMMUNITY): Payer: Self-pay | Admitting: *Deleted

## 2022-11-27 ENCOUNTER — Emergency Department (HOSPITAL_COMMUNITY)
Admission: EM | Admit: 2022-11-27 | Discharge: 2022-11-27 | Disposition: A | Discharge status: Home / Self Care | Payer: Medicaid Other | Attending: Emergency Medicine | Admitting: Emergency Medicine

## 2022-11-27 ENCOUNTER — Other Ambulatory Visit: Payer: Self-pay

## 2022-11-27 DIAGNOSIS — K625 Hemorrhage of anus and rectum: Secondary | ICD-10-CM | POA: Diagnosis present

## 2022-11-27 DIAGNOSIS — Z79899 Other long term (current) drug therapy: Secondary | ICD-10-CM | POA: Diagnosis not present

## 2022-11-27 DIAGNOSIS — Z7982 Long term (current) use of aspirin: Secondary | ICD-10-CM | POA: Insufficient documentation

## 2022-11-27 DIAGNOSIS — D649 Anemia, unspecified: Secondary | ICD-10-CM | POA: Diagnosis not present

## 2022-11-27 DIAGNOSIS — R109 Unspecified abdominal pain: Secondary | ICD-10-CM | POA: Diagnosis not present

## 2022-11-27 DIAGNOSIS — I10 Essential (primary) hypertension: Secondary | ICD-10-CM | POA: Insufficient documentation

## 2022-11-27 LAB — COMPREHENSIVE METABOLIC PANEL
ALT: 17 U/L (ref 0–44)
AST: 19 U/L (ref 15–41)
Albumin: 4.1 g/dL (ref 3.5–5.0)
Alkaline Phosphatase: 64 U/L (ref 38–126)
Anion gap: 10 (ref 5–15)
BUN: 21 mg/dL (ref 8–23)
CO2: 26 mmol/L (ref 22–32)
Calcium: 9.3 mg/dL (ref 8.9–10.3)
Chloride: 101 mmol/L (ref 98–111)
Creatinine, Ser: 1.42 mg/dL — ABNORMAL HIGH (ref 0.61–1.24)
GFR, Estimated: 56 mL/min — ABNORMAL LOW (ref >60)
Glucose, Bld: 156 mg/dL — ABNORMAL HIGH (ref 70–99)
Potassium: 3.7 mmol/L (ref 3.5–5.1)
Sodium: 137 mmol/L (ref 135–145)
Total Bilirubin: 0.7 mg/dL (ref 0.3–1.2)
Total Protein: 7.2 g/dL (ref 6.5–8.1)

## 2022-11-27 LAB — TYPE AND SCREEN
ABO/RH(D): O POS
Antibody Screen: NEGATIVE

## 2022-11-27 LAB — CBC
HCT: 38.4 % — ABNORMAL LOW (ref 39.0–52.0)
Hemoglobin: 12.1 g/dL — ABNORMAL LOW (ref 13.0–17.0)
MCH: 26.1 pg (ref 26.0–34.0)
MCHC: 31.5 g/dL (ref 30.0–36.0)
MCV: 82.8 fL (ref 80.0–100.0)
Platelets: 176 10*3/uL (ref 150–400)
RBC: 4.64 MIL/uL (ref 4.22–5.81)
RDW: 14.3 % (ref 11.5–15.5)
WBC: 3.9 10*3/uL — ABNORMAL LOW (ref 4.0–10.5)
nRBC: 0 % (ref 0.0–0.2)

## 2022-11-27 LAB — D-DIMER, QUANTITATIVE: D-Dimer, Quant: 0.8 ug/mL-FEU — ABNORMAL HIGH (ref 0.00–0.50)

## 2022-11-27 LAB — BRAIN NATRIURETIC PEPTIDE: B Natriuretic Peptide: 19 pg/mL (ref 0.0–100.0)

## 2022-11-27 MED ORDER — IOHEXOL 350 MG/ML SOLN
75.0000 mL | Freq: Once | INTRAVENOUS | Status: AC | PRN
  Administered 2022-11-27: 67 mL via INTRAVENOUS

## 2022-11-27 MED ORDER — IOHEXOL 300 MG/ML  SOLN
100.0000 mL | Freq: Once | INTRAMUSCULAR | Status: AC | PRN
  Administered 2022-11-27: 100 mL via INTRAVENOUS

## 2022-11-27 NOTE — ED Provider Notes (Signed)
[facility] [place] EMERGENCY DEPARTMENT AT [facility] Provider Note   CSN: [id number] Arrival date & time: 11/27/22  1409     History {Add pertinent medical, surgical, social history, OB history to HPI:1} Chief Complaint  Patient presents with   Rectal Bleeding    Isaac Lopez is a 62 y.o. male.  Patient complains of some rectal bleeding..  Patient has a history of hypertension.  He states he has been bleeding off and on for a while and he has pain with having a bowel movement.  Patient has seen his primary care doctor who has arranged GI follow-up   Rectal Bleeding      Home Medications Prior to Admission medications   Medication Sig Start Date End Date Taking? Authorizing Provider  aspirin EC 81 MG tablet Take 81 mg by mouth daily. Swallow whole.    [provider]  fenofibrate (TRICOR) 145 MG tablet Take 145 mg by mouth daily.    [provider]  losartan (COZAAR) 50 MG tablet Take by mouth daily.    [provider]      Allergies    Other    Review of Systems   Review of Systems  Gastrointestinal:  Positive for hematochezia.    Physical Exam Updated Vital Signs BP (!) 136/98   Pulse 74   Temp 98.5 F (36.9 C)   Resp 14   Ht 5\' 9"  (1.753 m)   Wt 97.5 kg   SpO2 94%   BMI 31.75 kg/m  Physical Exam  ED Results / Procedures / Treatments   Labs (all labs ordered are listed, but only abnormal results are displayed) Labs Reviewed  COMPREHENSIVE METABOLIC PANEL - Abnormal; Notable for the following components:      Result Value   Glucose, Bld 156 (*)    Creatinine, Ser 1.42 (*)    GFR, Estimated 56 (*)    All other components within normal limits  CBC - Abnormal; Notable for the following components:   WBC 3.9 (*)    Hemoglobin 12.1 (*)    HCT 38.4 (*)    All other components within normal limits  D-DIMER, QUANTITATIVE - Abnormal; Notable for the following components:   D-Dimer, Quant 0.80 (*)    All other components  within normal limits  BRAIN NATRIURETIC PEPTIDE  POC OCCULT BLOOD, ED  TYPE AND SCREEN    EKG None  Radiology CT Angio Chest PE W and/or Wo Contrast  Result Date: 11/27/2022 CLINICAL DATA:  Pulmonary embolism suspected, high probability. Rectal bleeding. EXAM: CT ANGIOGRAPHY CHEST WITH CONTRAST TECHNIQUE: Multidetector CT imaging of the chest was performed using the standard protocol during bolus administration of intravenous contrast. Multiplanar CT image reconstructions and MIPs were obtained to evaluate the vascular anatomy. RADIATION DOSE REDUCTION: This exam was performed according to the departmental dose-optimization program which includes automated exposure control, adjustment of the mA and/or kV according to patient size and/or use of iterative reconstruction technique. CONTRAST:  67mL OMNIPAQUE IOHEXOL 350 MG/ML SOLN COMPARISON:  None Available. FINDINGS: Cardiovascular: Heart is normal in size and there is no pericardial effusion. Scattered coronary artery calcifications are present. The aorta and pulmonary trunk are normal in caliber. No evidence of pulmonary embolism. Mediastinum/Nodes: No enlarged mediastinal, hilar, or axillary lymph nodes. Thyroid gland, trachea, and esophagus demonstrate no significant findings. Lungs/Pleura: Atelectasis is noted bilaterally. No effusion or pneumothorax. Upper Abdomen: Cyst is present in the upper pole of the right kidney. No acute abnormality. Musculoskeletal: No acute osseous abnormality.  Review of the MIP images confirms the above findings. IMPRESSION: 1. No evidence of pulmonary embolism or other acute process. 2. Scattered coronary artery calcifications. Electronically Signed   By: Thornell Sartorius M.D.   On: 11/27/2022 20:31   DG Chest 2 View  Result Date: 11/27/2022 CLINICAL DATA:  Shortness of breath. EXAM: CHEST - 2 VIEW COMPARISON:  November 23, 2022 FINDINGS: The heart size and mediastinal contours are within normal limits. Mild linear scarring  and/or atelectasis is seen within the bilateral lung bases. There is no evidence of acute infiltrate, pleural effusion or pneumothorax. Degenerative changes are seen within the lower thoracic spine. IMPRESSION: Mild bibasilar linear scarring and/or atelectasis. Electronically Signed   By: Aram Candela M.D.   On: 11/27/2022 19:35   CT ABDOMEN PELVIS W CONTRAST  Result Date: 11/27/2022 CLINICAL DATA:  Non localized abdominal pain with intermittent rectal bleeding. EXAM: CT ABDOMEN AND PELVIS WITH CONTRAST TECHNIQUE: Multidetector CT imaging of the abdomen and pelvis was performed using the standard protocol following bolus administration of intravenous contrast. RADIATION DOSE REDUCTION: This exam was performed according to the departmental dose-optimization program which includes automated exposure control, adjustment of the mA and/or kV according to patient size and/or use of iterative reconstruction technique. CONTRAST:  OMNIPAQUE IOHEXOL 300 MG/ML  SOLN COMPARISON:  12/12/2013 FINDINGS: Lower chest: Scattered areas of atelectasis are noted in the lung bases. Hepatobiliary: No suspicious focal abnormality within the liver parenchyma. Gallbladder is nondistended. No intrahepatic or extrahepatic biliary dilation. Pancreas: No focal mass lesion. No dilatation of the main duct. No intraparenchymal cyst. No peripancreatic edema. Spleen: No splenomegaly. No focal mass lesion. Adrenals/Urinary Tract: No adrenal nodule or mass. 5.7 cm simple cyst noted upper pole right kidney. No followup imaging is recommended. Left kidney unremarkable. No evidence for hydroureter. The urinary bladder appears normal for the degree of distention. Stomach/Bowel: Stomach is unremarkable. No gastric wall thickening. No evidence of outlet obstruction. Duodenum is normally positioned as is the ligament of Treitz. No small bowel wall thickening. No small bowel dilatation. The terminal ileum is normal. The appendix is normal. No  gross colonic mass. Possible mild wall thickening in the rectum (image 69/2). Vascular/Lymphatic: No abdominal aortic aneurysm. No abdominal aortic atherosclerotic calcification. There is no gastrohepatic or hepatoduodenal ligament lymphadenopathy. No retroperitoneal or mesenteric lymphadenopathy. No pelvic sidewall lymphadenopathy. Reproductive: The prostate gland and seminal vesicles are unremarkable. Other: No intraperitoneal free fluid. Musculoskeletal: No worrisome lytic or sclerotic osseous abnormality. IMPRESSION: 1. Possible mild wall thickening in the rectum. Otherwise unremarkable CT appearance of the colon 2. No other acute findings on the current study. Electronically Signed   By: Kennith Center M.D.   On: 11/27/2022 17:16    Procedures Procedures  {Document cardiac monitor, telemetry assessment procedure when appropriate:1}  Medications Ordered in ED Medications  iohexol (OMNIPAQUE) 300 MG/ML solution 100 mL (100 mLs Intravenous Contrast Given 11/27/22 1702)  iohexol (OMNIPAQUE) 350 MG/ML injection 75 mL (67 mLs Intravenous Contrast Given 11/27/22 2009)    ED Course/ Medical Decision Making/ A&P  Patient has had mild episodes of hypoxia while he was in the emergency department.  At the time of discharge he was ambulated and his pulse ox stayed over 90%. {   Click here for ABCD2, HEART and other calculatorsREFRESH Note before signing :1}                          Medical Decision Making Amount and/or Complexity of Data  Reviewed Labs: ordered. Radiology: ordered. ECG/medicine tests: ordered.  Risk Prescription drug management.  Patient with rectal bleeding and mild wall thickening in the rectum.  He is referred to general surgery and he will follow-up with GI also  {Document critical care time when appropriate:1} {Document review of labs and clinical decision tools ie heart score, Chads2Vasc2 etc:1}  {Document your independent review of radiology images, and any outside  records:1} {Document your discussion with family members, caretakers, and with consultants:1} {Document social determinants of health affecting pt's care:1} {Document your decision making why or why not admission, treatments were needed:1} Final Clinical Impression(s) / ED Diagnoses Final diagnoses:  Rectal bleeding    Rx / DC Orders ED Discharge Orders     None

## 2022-11-27 NOTE — ED Notes (Signed)
Pts SpO2 while ambulating on RA dropped to 86%. Pt is currently resting on RA at 97%; However, he does occasionally drop down to the high 80s resting on RA--MD made aware.

## 2022-11-27 NOTE — Discharge Instructions (Signed)
Follow-up with Dr. Lovell Sheehan or one of his colleagues for your swelling in your rectum and rectal bleeding

## 2022-11-27 NOTE — ED Notes (Signed)
Pt placed on 3 L via [place] due to SpO2 being in the 80s, SpO2 is now in the 90s

## 2022-11-27 NOTE — ED Triage Notes (Signed)
Pt with rectal bleeding on and off since April, pt states over past two weeks has gotten worse. Pt states stools are maroon color.

## 2022-11-27 NOTE — ED Notes (Signed)
Ambulatory in hallway on RA at 94%

## 2022-11-29 ENCOUNTER — Telehealth: Payer: Self-pay

## 2022-11-30 ENCOUNTER — Telehealth: Payer: Self-pay

## 2022-11-30 ENCOUNTER — Encounter: Payer: Self-pay | Admitting: Gastroenterology

## 2022-11-30 NOTE — Telephone Encounter (Signed)
Made first attempt to f/u with pt by phone and text message but pt was unavailable at the time of the call.     This call was in attempt to provide nurse case management to ensure pt did not have any additional questions or concerns regarding any post hospital instruction,medical concerns, obtaining medications and to serve as a reminder of any upcoming appointments, and to address any other socio determinant of health needs, if applicable  Will attempt to make additional follow up phone contact on 6.12.24, if pt does not attempt  to return call first

## 2022-11-30 NOTE — Telephone Encounter (Signed)
Completed a hospital f/u call as a result of the pt being seen at ER on 6.9.24  Successfully spoke with patient on today as result of 2nd attempt.   He states he has no current medical needs as it relates medications or any additional medical concerns since being released   States he is doing ok, but states he will be having an upcoming GI specialist  for colonscopy up and coming June  Pt is connected to  PCP at the [facility] Dept with next appt is 09/15/22)in addition to his specialist that he has on file (see [facility] section for next appts with 2 specialist - Rheumatologists an Gastroenterology upcoming)  Pt was re-educated on the use of Urgent vs Emergency room in the event his PCP office is not available or open for business.    Urgent vs emergency conditions were also reviewed with the patient.   Pt continues to be connected with his PCP at the [facility] Dept in addition to his specialist that he has on file (see [facility] section for next appts with specialist)   Pt states he understood and  call ended

## 2022-12-02 NOTE — Progress Notes (Unsigned)
Office Visit Note  Patient: Isaac Lopez             Date of Birth: 08/25/1960           MRN: [id number]             PCP: Tylene Fantasia., PA-C Referring: Vickki Hearing, MD Visit Date: 12/16/2022 Occupation: @GUAROCC @  Subjective:  Pain and swelling of right knee and right ankle  History of Present Illness: Isaac Lopez is a 62 y.o. male seen in consultation at the request of Dr. Romeo Apple.  According the patient he drove to [facility] in April 2023 when he came back he started feeling some stiffness in his joints.  In May 2023 he went to [place] and came back.  He states he noticed right ankle joint swelling which gradually improved.  He had to go back to [facility] in June 2023 and on returning he noticed pain and swelling in his right knee joint.  He states he went to the emergency room where he was advised ice pack and anti-inflammatories.  In July 2023 due to persistent swelling he went to [facility] where he had right knee joint aspirated and had ultrasound evaluation.  He continued anti-inflammatories.  Patient presented to Dr. Romeo Apple in December 2023 with right knee joint swelling .  At the time right knee joint was aspirated.  Synovial fluid was noted inflammatory and the crystals were negative.  According to Dr. Mort Sawyers note patient sed rate was 36 and thrombocytopenia was noted.  X-rays at that time showed mild osteoarthritis.  He was given a prednisone taper by Dr. Romeo Apple in December.  Did the prednisone help when he gave you prednisone.  According to the patient prednisone helped.  His knee joint symptoms are gradually improved but he still have discomfort in his right knee joint.  He states his right ankle joint swelling has been persistent.  He has difficulty walking at times due to right ankle joint pain and swelling.  None of the other joints are painful or swollen.  There is family history of gout in his brother and mother.  Some of his siblings have osteoarthritis.   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  He enjoys hiking and fishing although he has not done hiking in a while.  He walks about 2 miles daily without difficulty.  He has noticed occasional blood in his stool.  He has an appointment coming up with the gastroenterologist.  He denies any history of diarrhea.  There is no family history of IBD or psoriasis.    Activities of Daily Living:  Patient reports morning stiffness for 5-10 minutes.   Patient Reports nocturnal pain.  Difficulty dressing/grooming: Denies Difficulty climbing stairs: Reports Difficulty getting out of chair: Denies Difficulty using hands for taps, buttons, cutlery, and/or writing: Denies  Review of Systems  Constitutional:  Negative for fatigue.  HENT:  Negative for mouth sores and mouth dryness.   Eyes:  Negative for dryness.  Respiratory:  Negative for shortness of breath.   Cardiovascular:  Negative for chest pain and palpitations.  Gastrointestinal:  Positive for blood in stool. Negative for constipation and diarrhea.       Scheduled with GI  Endocrine: Negative for increased urination.  Genitourinary:  Negative for involuntary urination.  Musculoskeletal:  Positive for joint pain, gait problem, joint pain, joint swelling and morning stiffness. Negative  for myalgias, muscle weakness, muscle tenderness and myalgias.  Skin:  Negative for color change, rash and sensitivity to sunlight.  Allergic/Immunologic: Negative for susceptible to infections.  Neurological:  Negative for dizziness and headaches.  Hematological:  Negative for swollen glands.  Psychiatric/Behavioral:  Negative for depressed mood and sleep disturbance. The patient is not nervous/anxious.     PMFS History:  Patient Active Problem List   Diagnosis Date Noted   Disc disease, degenerative, cervical 10/26/2020   Eczematoid otitis externa 10/26/2020   Erectile  dysfunction 10/26/2020   Essential hypertension 10/26/2020   Thrombocytopenia (HCC) 10/26/2020   Biliary sludge determined by ultrasound 12/02/2014   Right-sided chest pain 12/02/2014    Past Medical History:  Diagnosis Date   Chest pain    Hyperlipemia    Hypertension     Family History  Problem Relation Age of Onset   Diabetes Mother    Cancer Father        throat   Emphysema Sister    Cirrhosis Sister    Thyroid cancer Sister    Arthritis Brother    Past Surgical History:  Procedure Laterality Date   BUNIONECTOMY Right    Social History   Social History Narrative   Not on file    There is no immunization history on file for this patient.   Objective: Vital Signs: BP 135/80 (BP Location: Right Arm, Patient Position: Sitting, Cuff Size: Normal)   Pulse 88   Resp 18   Ht 5\' 9"  (1.753 m)   Wt 214 lb 9.6 oz (97.3 kg)   BMI 31.69 kg/m    Physical Exam Vitals and nursing note reviewed.  Constitutional:      Appearance: He is well-developed.  HENT:     Head: Normocephalic and atraumatic.  Eyes:     Conjunctiva/sclera: Conjunctivae normal.     Pupils: Pupils are equal, round, and reactive to light.  Cardiovascular:     Rate and Rhythm: Normal rate and regular rhythm.     Heart sounds: Normal heart sounds.  Pulmonary:     Effort: Pulmonary effort is normal.     Breath sounds: Normal breath sounds.  Abdominal:     General: Bowel sounds are normal.     Palpations: Abdomen is soft.  Musculoskeletal:     Cervical back: Normal range of motion and neck supple.  Skin:    General: Skin is warm and dry.     Capillary Refill: Capillary refill takes less than 2 seconds.  Neurological:     Mental Status: He is alert and oriented to person, place, and time.  Psychiatric:        Behavior: Behavior normal.      Musculoskeletal Exam: He had good range of motion of the cervical spine without discomfort.  He had limited range of motion of the lumbar spine with some  discomfort.  There was no SI joint tenderness.  Shoulder joints, elbow joints, wrist joints were in good range of motion.  He had bilateral PIP and DIP thickening with no synovitis.  Hip joints were in good range of motion.  Knee joints were in good range of motion with discomfort in the right knee joint.  No warmth and swelling of the right knee joint was noted.  The right ankle joint was swollen and warm to touch.  Left ankle joint was in good range of motion without any swelling.  There was no tenderness or MTPs.  CDAI Exam: CDAI Score: 12  Patient Global:  50 / 100; Provider Global: 50 / 100 Swollen: 2 ; Tender: 3  Joint Exam 12/16/2022      Right  Left  Lumbar Spine   Tender     Knee  Swollen Tender     Ankle  Swollen Tender        Investigation: No additional findings.  Imaging: XR Ankle 2 Views Right  Result Date: 12/18/2022 No tibiotalar narrowing was noted.  Subtalar narrowing was noted.  No intertarsal joint space narrowing was noted.  Pins were noted in the first MTP joint.  Soft tissue swelling was noted.  Radiodensity noted on the lateral aspect of the distal tibia suggesting possible calcified intraosseous membrane. Impression: Subtalar joint space narrowing was noted.  Soft tissue swelling was noted.  XR KNEE 3 VIEW RIGHT  Result Date: 12/16/2022 Moderate medial compartment narrowing was noted.  Severe patellofemoral narrowing was noted.  No chondrocalcinosis was noted. Impression: These findings are suggestive of moderate osteoarthritis and severe chondromalacia patella.  XR Lumbar Spine 2-3 Views  Result Date: 12/16/2022 Dextroscoliosis was noted.  Multilevel spondylosis with most significant narrowing between L2-L3 and L3-L4 was noted.  Anterior spurring was noted.  Facet joint arthropathy with foraminal narrowing was noted. Impression: These findings are suggestive of degenerative disc disease of the lumbar spine and facet joint arthropathy  DG Chest 2 View  Result  Date: 11/28/2022 CLINICAL DATA:  63 year old male with history of productive cough for the past 3 weeks. EXAM: CHEST - 2 VIEW COMPARISON:  Chest x-ray 07/09/2022. FINDINGS: Prominent interstitial markings in the lung bases bilaterally, similar to the prior study, suggesting areas of subsegmental atelectasis and/or postinfectious/inflammatory scarring. No acute consolidative airspace disease. No pleural effusions. No suspicious appearing pulmonary nodules or masses are noted. No evidence of pulmonary edema. Heart size is normal. Upper mediastinal contours are within normal limits. IMPRESSION: 1. Probable areas of bibasilar subsegmental atelectasis and/or scarring. Electronically Signed   By: Trudie Reed M.D.   On: 11/28/2022 13:35   CT Angio Chest PE W and/or Wo Contrast  Result Date: 11/27/2022 CLINICAL DATA:  Pulmonary embolism suspected, high probability. Rectal bleeding. EXAM: CT ANGIOGRAPHY CHEST WITH CONTRAST TECHNIQUE: Multidetector CT imaging of the chest was performed using the standard protocol during bolus administration of intravenous contrast. Multiplanar CT image reconstructions and MIPs were obtained to evaluate the vascular anatomy. RADIATION DOSE REDUCTION: This exam was performed according to the departmental dose-optimization program which includes automated exposure control, adjustment of the mA and/or kV according to patient size and/or use of iterative reconstruction technique. CONTRAST:  67mL OMNIPAQUE IOHEXOL 350 MG/ML SOLN COMPARISON:  None Available. FINDINGS: Cardiovascular: Heart is normal in size and there is no pericardial effusion. Scattered coronary artery calcifications are present. The aorta and pulmonary trunk are normal in caliber. No evidence of pulmonary embolism. Mediastinum/Nodes: No enlarged mediastinal, hilar, or axillary lymph nodes. Thyroid gland, trachea, and esophagus demonstrate no significant findings. Lungs/Pleura: Atelectasis is noted bilaterally. No effusion  or pneumothorax. Upper Abdomen: Cyst is present in the upper pole of the right kidney. No acute abnormality. Musculoskeletal: No acute osseous abnormality. Review of the MIP images confirms the above findings. IMPRESSION: 1. No evidence of pulmonary embolism or other acute process. 2. Scattered coronary artery calcifications. Electronically Signed   By: Thornell Sartorius M.D.   On: 11/27/2022 20:31   DG Chest 2 View  Result Date: 11/27/2022 CLINICAL DATA:  Shortness of breath. EXAM: CHEST - 2 VIEW COMPARISON:  November 23, 2022 FINDINGS: The heart size  and mediastinal contours are within normal limits. Mild linear scarring and/or atelectasis is seen within the bilateral lung bases. There is no evidence of acute infiltrate, pleural effusion or pneumothorax. Degenerative changes are seen within the lower thoracic spine. IMPRESSION: Mild bibasilar linear scarring and/or atelectasis. Electronically Signed   By: Aram Candela M.D.   On: 11/27/2022 19:35   CT ABDOMEN PELVIS W CONTRAST  Result Date: 11/27/2022 CLINICAL DATA:  Non localized abdominal pain with intermittent rectal bleeding. EXAM: CT ABDOMEN AND PELVIS WITH CONTRAST TECHNIQUE: Multidetector CT imaging of the abdomen and pelvis was performed using the standard protocol following bolus administration of intravenous contrast. RADIATION DOSE REDUCTION: This exam was performed according to the departmental dose-optimization program which includes automated exposure control, adjustment of the mA and/or kV according to patient size and/or use of iterative reconstruction technique. CONTRAST:  OMNIPAQUE IOHEXOL 300 MG/ML  SOLN COMPARISON:  12/12/2013 FINDINGS: Lower chest: Scattered areas of atelectasis are noted in the lung bases. Hepatobiliary: No suspicious focal abnormality within the liver parenchyma. Gallbladder is nondistended. No intrahepatic or extrahepatic biliary dilation. Pancreas: No focal mass lesion. No dilatation of the main duct. No  intraparenchymal cyst. No peripancreatic edema. Spleen: No splenomegaly. No focal mass lesion. Adrenals/Urinary Tract: No adrenal nodule or mass. 5.7 cm simple cyst noted upper pole right kidney. No followup imaging is recommended. Left kidney unremarkable. No evidence for hydroureter. The urinary bladder appears normal for the degree of distention. Stomach/Bowel: Stomach is unremarkable. No gastric wall thickening. No evidence of outlet obstruction. Duodenum is normally positioned as is the ligament of Treitz. No small bowel wall thickening. No small bowel dilatation. The terminal ileum is normal. The appendix is normal. No gross colonic mass. Possible mild wall thickening in the rectum (image 69/2). Vascular/Lymphatic: No abdominal aortic aneurysm. No abdominal aortic atherosclerotic calcification. There is no gastrohepatic or hepatoduodenal ligament lymphadenopathy. No retroperitoneal or mesenteric lymphadenopathy. No pelvic sidewall lymphadenopathy. Reproductive: The prostate gland and seminal vesicles are unremarkable. Other: No intraperitoneal free fluid. Musculoskeletal: No worrisome lytic or sclerotic osseous abnormality. IMPRESSION: 1. Possible mild wall thickening in the rectum. Otherwise unremarkable CT appearance of the colon 2. No other acute findings on the current study. Electronically Signed   By: Kennith Center M.D.   On: 11/27/2022 17:16    Recent Labs: Lab Results  Component Value Date   WBC 5.5 12/16/2022   HGB 12.3 (L) 12/16/2022   PLT 183 12/16/2022   NA 135 12/16/2022   K 3.3 (L) 12/16/2022   CL 102 12/16/2022   CO2 24 12/16/2022   GLUCOSE 82 12/16/2022   BUN 15 12/16/2022   CREATININE 1.37 (H) 12/16/2022   BILITOT 0.8 12/16/2022   ALKPHOS 67 12/16/2022   AST 21 12/16/2022   ALT 17 12/16/2022   PROT 7.5 12/16/2022   ALBUMIN 4.0 12/16/2022   CALCIUM 9.3 12/16/2022   GFRAA >60 12/08/2019   January 29, 2022 synovial fluid cell count 289, RBC 32,500, crystals  negative. June 09, 2022 WBC 374   Speciality Comments: No specialty comments available.  Procedures:  No procedures performed Allergies: Other   Assessment / Plan:     Visit Diagnoses: Pain and swelling of right knee -patient has been experiencing recurrent pain and swelling of the right knee joint since April 2023.  He had knee joint aspiration x 2 months in July 2023 at [facility] and then by Dr. Romeo Apple in December 2023.  Synovial fluid was mildly inflammatory.  Crystals were negative.  He  had good response to prednisone taper given by Dr. Romeo Apple in December.  He continues to have intermittent discomfort in his knee joint.  Warmth and swelling was noted in the right knee joint today.  Mild effusion was also noted.  Plan: XR KNEE 3 VIEW RIGHT.  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  Right ankle swelling -he has been having recurrent swelling of the right ankle joint since May 2023.  Right ankle joint with  warm and swollen today.  He has difficulty walking due to right ankle joint persistent swelling.  He has taken NSAIDs for a while.  Avoidance of NSAIDs was discussed due to history of elevated creatinine and recent GI bleed.  I would avoid prednisone currently as he is a scheduled to have GI workup most likely colonoscopy.  Plan: XR Ankle 2 Views Right, soft tissue swelling was noted.  Subtalar joint space narrowing was noted.  Calcification noted on the radial aspect of distal tibia suggestive of possible intraosseous membrane calcification.  Uric acid, Sedimentation rate, Rheumatoid factor, Cyclic citrul peptide antibody, IgG, ACE  Disc disease, degenerative, cervical-patient gives history of degenerative disease of cervical spine.  I reviewed x-rays from October 2021 which showed moderate C5-C6 degenerative disc disease and diffuse facet joint arthropathy.  Chronic midline low back pain without sciatica -he has been having increased discomfort  in his lower back.  There is no radiculopathy.  Plan: XR Lumbar Spine 2-3 Views.  X-rays showed multilevel spondylosis with most severe narrowing between L3-L4.  Facet joint arthropathy was noted.  High risk medication use -in anticipation to starting on future treatment I will obtain following labs today.  Plan: CBC with Differential/Platelet, COMPLETE METABOLIC PANEL WITH GFR, Hepatitis B core antibody, IgM, Hepatitis B surface antigen, Hepatitis C antibody, Glucose 6 phosphate dehydrogenase, QuantiFERON-TB Gold Plus, Serum protein electrophoresis with reflex, IgG, IgA, IgM  Essential hypertension-blood pressure was normal at 135/80.  Thrombocytopenia (HCC) - Platelets normal on November 27, 2022 176 - Plan: ANA  Stage 3a chronic kidney disease (HCC)-creatinine has been elevated-last creatinine was 1.42 on November 27, 2022.  Rectal bleeding-patient gives history of intermittent rectal bleeding.  GI appointment is pending.  He denies any history of diarrhea.  There is no family history of IBD.  Orders: Orders Placed This Encounter  Procedures   XR KNEE 3 VIEW RIGHT   XR Ankle 2 Views Right   XR Lumbar Spine 2-3 Views   CBC with Differential/Platelet   COMPLETE METABOLIC PANEL WITH GFR   Uric acid   Sedimentation rate   Rheumatoid factor   Cyclic citrul peptide antibody, IgG   Hepatitis B core antibody, IgM   Hepatitis B surface antigen   Hepatitis C antibody   Glucose 6 phosphate dehydrogenase   QuantiFERON-TB Gold Plus   Serum protein electrophoresis with reflex   IgG, IgA, IgM   ANA   Angiotensin converting enzyme   No orders of the defined types were placed in this encounter.    Follow-Up Instructions: Return for Inflammatory arthritis.   Pollyann Savoy, MD  Note - This record has been created using Animal nutritionist.  Chart creation errors have been sought, but may not always  have been located. Such creation errors do not reflect on  the standard of medical care.

## 2022-12-16 ENCOUNTER — Encounter: Payer: Self-pay | Admitting: Rheumatology

## 2022-12-16 ENCOUNTER — Ambulatory Visit (INDEPENDENT_AMBULATORY_CARE_PROVIDER_SITE_OTHER): Payer: Medicaid Other

## 2022-12-16 ENCOUNTER — Ambulatory Visit (INDEPENDENT_AMBULATORY_CARE_PROVIDER_SITE_OTHER): Payer: Medicaid Other | Admitting: Rheumatology

## 2022-12-16 ENCOUNTER — Other Ambulatory Visit
Admission: RE | Admit: 2022-12-16 | Discharge: 2022-12-16 | Disposition: A | Discharge status: Home / Self Care | Payer: Medicaid Other | Source: Ambulatory Visit | Attending: Rheumatology | Admitting: Rheumatology

## 2022-12-16 VITALS — BP 135/80 | HR 88 | Resp 18 | Ht 69.0 in | Wt 214.6 lb

## 2022-12-16 DIAGNOSIS — M25461 Effusion, right knee: Secondary | ICD-10-CM

## 2022-12-16 DIAGNOSIS — M5030 Other cervical disc degeneration, unspecified cervical region: Secondary | ICD-10-CM

## 2022-12-16 DIAGNOSIS — K625 Hemorrhage of anus and rectum: Secondary | ICD-10-CM

## 2022-12-16 DIAGNOSIS — D696 Thrombocytopenia, unspecified: Secondary | ICD-10-CM | POA: Insufficient documentation

## 2022-12-16 DIAGNOSIS — M25471 Effusion, right ankle: Secondary | ICD-10-CM | POA: Diagnosis not present

## 2022-12-16 DIAGNOSIS — G8929 Other chronic pain: Secondary | ICD-10-CM

## 2022-12-16 DIAGNOSIS — Z79899 Other long term (current) drug therapy: Secondary | ICD-10-CM | POA: Insufficient documentation

## 2022-12-16 DIAGNOSIS — M25561 Pain in right knee: Secondary | ICD-10-CM | POA: Diagnosis not present

## 2022-12-16 DIAGNOSIS — N1831 Chronic kidney disease, stage 3a: Secondary | ICD-10-CM

## 2022-12-16 DIAGNOSIS — M545 Low back pain, unspecified: Secondary | ICD-10-CM

## 2022-12-16 DIAGNOSIS — I10 Essential (primary) hypertension: Secondary | ICD-10-CM

## 2022-12-16 LAB — CBC WITH DIFFERENTIAL/PLATELET
Abs Immature Granulocytes: 0.02 10*3/uL (ref 0.00–0.07)
Basophils Absolute: 0 10*3/uL (ref 0.0–0.1)
Basophils Relative: 0 %
Eosinophils Absolute: 0.1 10*3/uL (ref 0.0–0.5)
Eosinophils Relative: 2 %
HCT: 39 % (ref 39.0–52.0)
Hemoglobin: 12.3 g/dL — ABNORMAL LOW (ref 13.0–17.0)
Immature Granulocytes: 0 %
Lymphocytes Relative: 44 %
Lymphs Abs: 2.3 10*3/uL (ref 0.7–4.0)
MCH: 25.6 pg — ABNORMAL LOW (ref 26.0–34.0)
MCHC: 31.5 g/dL (ref 30.0–36.0)
MCV: 81.3 fL (ref 80.0–100.0)
Monocytes Absolute: 0.7 10*3/uL (ref 0.1–1.0)
Monocytes Relative: 13 %
Neutro Abs: 2.3 10*3/uL (ref 1.7–7.7)
Neutrophils Relative %: 41 %
Platelets: 183 10*3/uL (ref 150–400)
RBC: 4.8 MIL/uL (ref 4.22–5.81)
RDW: 14.3 % (ref 11.5–15.5)
WBC: 5.5 10*3/uL (ref 4.0–10.5)
nRBC: 0 % (ref 0.0–0.2)

## 2022-12-16 LAB — COMPREHENSIVE METABOLIC PANEL
ALT: 17 U/L (ref 0–44)
AST: 21 U/L (ref 15–41)
Albumin: 4 g/dL (ref 3.5–5.0)
Alkaline Phosphatase: 67 U/L (ref 38–126)
Anion gap: 9 (ref 5–15)
BUN: 15 mg/dL (ref 8–23)
CO2: 24 mmol/L (ref 22–32)
Calcium: 9.3 mg/dL (ref 8.9–10.3)
Chloride: 102 mmol/L (ref 98–111)
Creatinine, Ser: 1.37 mg/dL — ABNORMAL HIGH (ref 0.61–1.24)
GFR, Estimated: 58 mL/min — ABNORMAL LOW (ref >60)
Glucose, Bld: 82 mg/dL (ref 70–99)
Potassium: 3.3 mmol/L — ABNORMAL LOW (ref 3.5–5.1)
Sodium: 135 mmol/L (ref 135–145)
Total Bilirubin: 0.8 mg/dL (ref 0.3–1.2)
Total Protein: 7.5 g/dL (ref 6.5–8.1)

## 2022-12-16 LAB — HEPATITIS B CORE ANTIBODY, TOTAL: Hep B Core Total Ab: NONREACTIVE

## 2022-12-16 LAB — URIC ACID: Uric Acid, Serum: 5.9 mg/dL (ref 3.7–8.6)

## 2022-12-16 LAB — SEDIMENTATION RATE: Sed Rate: 10 mm/hr (ref 0–16)

## 2022-12-16 LAB — HEPATITIS C ANTIBODY: HCV Ab: NONREACTIVE

## 2022-12-16 LAB — HEPATITIS B SURFACE ANTIGEN: Hepatitis B Surface Ag: NONREACTIVE

## 2022-12-17 LAB — ANGIOTENSIN CONVERTING ENZYME: Angiotensin-Converting Enzyme: 53 U/L (ref 14–82)

## 2022-12-18 ENCOUNTER — Telehealth: Payer: Self-pay | Admitting: Rheumatology

## 2022-12-18 LAB — IGG, IGA, IGM
IgA: 353 mg/dL (ref 61–437)
IgG (Immunoglobin G), Serum: 1290 mg/dL (ref 603–1613)
IgM (Immunoglobulin M), Srm: 74 mg/dL (ref 20–172)

## 2022-12-18 LAB — RHEUMATOID FACTOR: Rheumatoid fact SerPl-aCnc: 11.2 IU/mL (ref <14.0)

## 2022-12-18 LAB — ANTI-SMOOTH MUSCLE ANTIBODY, IGG: F-Actin IgG: 6 Units (ref 0–19)

## 2022-12-18 NOTE — Progress Notes (Signed)
Potassium is low at 3.3.  Please notify patient and forward results to his PCP.  Creatinine is elevated and stable.

## 2022-12-19 LAB — GLUCOSE 6 PHOSPHATE DEHYDROGENASE
G6PDH: 9 U/g{Hb} (ref 4.8–15.7)
Hemoglobin: 12.6 g/dL — ABNORMAL LOW (ref 13.0–17.7)

## 2022-12-19 LAB — CYCLIC CITRUL PEPTIDE ANTIBODY, IGG/IGA: CCP Antibodies IgG/IgA: 10 units (ref 0–19)

## 2022-12-20 LAB — ANTINUCLEAR ANTIBODIES, IFA: ANA Ab, IFA: NEGATIVE

## 2022-12-21 ENCOUNTER — Ambulatory Visit (INDEPENDENT_AMBULATORY_CARE_PROVIDER_SITE_OTHER): Payer: Medicaid Other | Admitting: Gastroenterology

## 2022-12-21 ENCOUNTER — Encounter: Payer: Self-pay | Admitting: Gastroenterology

## 2022-12-21 VITALS — BP 120/80 | HR 88 | Temp 97.5°F | Ht 69.0 in | Wt 213.8 lb

## 2022-12-21 DIAGNOSIS — K625 Hemorrhage of anus and rectum: Secondary | ICD-10-CM

## 2022-12-21 DIAGNOSIS — K6289 Other specified diseases of anus and rectum: Secondary | ICD-10-CM | POA: Diagnosis not present

## 2022-12-21 LAB — PROTEIN ELECTROPHORESIS, SERUM
A/G Ratio: 1.3 (ref 0.7–1.7)
Albumin ELP: 3.8 g/dL (ref 2.9–4.4)
Alpha-1-Globulin: 0.2 g/dL (ref 0.0–0.4)
Alpha-2-Globulin: 0.5 g/dL (ref 0.4–1.0)
Beta Globulin: 1 g/dL (ref 0.7–1.3)
Gamma Globulin: 1.2 g/dL (ref 0.4–1.8)
Globulin, Total: 3 g/dL (ref 2.2–3.9)
Total Protein ELP: 6.8 g/dL (ref 6.0–8.5)

## 2022-12-21 MED ORDER — PEG 3350-KCL-NA BICARB-NACL 420 G PO SOLR: 4000.0000 mL | Freq: Once | ORAL | 0 refills | Status: AC

## 2022-12-21 NOTE — Patient Instructions (Signed)
I will call in the compounded cream to [facility] and have them fill this after you call them on the 25th.  In meantime, continue preparation H 2-4 times a day, and you can get over-the-counter lidocaine ointment to use as needed.  Continue stool softeners. Add Miralax 1 capful daily to help keep stool soft. Avoid straining. Limit toilet time to 2-3 minutes.  We are arranging a colonoscopy with Dr. Marletta Lor in the near future!  Further recommendations to follow!  It was a pleasure to see you today. I want to create trusting relationships with patients and provide genuine, compassionate, and quality care. I truly value your feedback, so please be on the lookout for a survey regarding your visit with me today. I appreciate your time in completing this!         Gelene Mink, PhD, ANP-BC [facility]

## 2022-12-21 NOTE — Progress Notes (Signed)
Gastroenterology Office Note    Referring Provider: Tylene Fantasia., PA-C Primary Care Physician:  Tylene Fantasia., PA-C  Primary GI: Dr. Marletta Lor    Chief Complaint   Chief Complaint  Patient presents with   Rectal Bleeding    Patient here today due to rectal bleeding and swelling on the left side of rectum. Patient says it is bright red blood that he sees when this occurs. He says very painful when he has a bm. Patient says he has been using hemorrhoid cream which has helped some.      History of Present Illness   Isaac Lopez is a 62 y.o. male presenting today at the request of Muse, Rochelle D., PA-C due to rectal bleeding.   Colonoscopy over 10 years ago due to bleeding. Doesn't believe polyps. 10 brothers and sisters, and 1-2 of them have polyps. No FH colon cancer. Father: throat cancer   Symptoms present for about 3 months. Intermittent rectal bleeding. Feels on left side of rectum something swollen and feels painful when trying to pass stool. Takes stool softeners, high fiber. Takes awhile to to stop hurting. Knife-like pain. Worsened with BM. BM about every other day. No straining. Takes stool softener 3 daily. Using preparation H. Feels like he has emptied well after BM. Stool is soft.   No unexplained weight loss or lack of appetite. Takes Nexium OTC just as needed. No dysphagia. Has felt tightness in LLQ and across lower abdomen in past when went to ED.    Hgb 12.3, chronically in 12-14.   IMPRESSION: 1. Possible mild wall thickening in the rectum. Otherwise unremarkable CT appearance of the colon 2. No other acute findings on the current study   Noting chronic RLE swelling and joint pain intermittently. Went to Rheumatology for assessment.   Past Medical History:  Diagnosis Date   Chest pain    Hyperlipemia    Hypertension     Past Surgical History:  Procedure Laterality Date   BUNIONECTOMY Right     Current Outpatient Medications   Medication Sig Dispense Refill   aspirin EC 81 MG tablet Take 81 mg by mouth daily. Swallow whole.     cetirizine (ZYRTEC) 10 MG tablet Take 10 mg by mouth daily.     fenofibrate (TRICOR) 145 MG tablet Take 145 mg by mouth daily.     losartan (COZAAR) 50 MG tablet Take by mouth daily.     OVER THE COUNTER MEDICATION FLUID PILL ONCE PER MORNING PER PATIENT. HE DOES NOT KNOW THE NAME OF IT.     No current facility-administered medications for this visit.    Allergies as of 12/21/2022 - Review Complete 12/21/2022  Allergen Reaction Noted   Other  01/15/2011    Family History  Problem Relation Age of Onset   Diabetes Mother    Cancer Father        throat   Emphysema Sister    Cirrhosis Sister    Thyroid cancer Sister    Arthritis Brother     Social History   Socioeconomic History   Marital status: Single    Spouse name: Not on file   Number of children: Not on file   Years of education: Not on file   Highest education level: Not on file  Occupational History   Not on file  Tobacco Use   Smoking status: Never    Passive exposure: Past   Smokeless tobacco: Never  Vaping Use  Vaping Use: Never used  Substance and Sexual Activity   Alcohol use: No   Drug use: No   Sexual activity: Yes    Birth control/protection: Condom  Other Topics Concern   Not on file  Social History Narrative   Not on file   Social Determinants of Health   Financial Resource Strain: Not on file  Food Insecurity: Not on file  Transportation Needs: Not on file  Physical Activity: Not on file  Stress: Not on file  Social Connections: Not on file  Intimate Partner Violence: Not on file     Review of Systems   Gen: Denies any fever, chills, fatigue, weight loss, lack of appetite.  CV: Denies chest pain, heart palpitations, peripheral edema, syncope.  Resp: Denies shortness of breath at rest or with exertion. Denies wheezing or cough.  GI: Denies dysphagia or odynophagia. Denies  jaundice, hematemesis, fecal incontinence. GU : Denies urinary burning, urinary frequency, urinary hesitancy MS: Denies joint pain, muscle weakness, cramps, or limitation of movement.  Derm: Denies rash, itching, dry skin Psych: Denies depression, anxiety, memory loss, and confusion Heme: Denies bruising, bleeding, and enlarged lymph nodes.   Physical Exam   BP 120/80 (BP Location: Left Arm, Patient Position: Sitting, Cuff Size: Large)   Pulse 88   Temp (!) 97.5 F (36.4 C) (Temporal)   Ht 5\' 9"  (1.753 m)   Wt 213 lb 12.8 oz (97 kg)   BMI 31.57 kg/m  General:   Alert and oriented. Pleasant and cooperative. Well-nourished and well-developed.  Head:  Normocephalic and atraumatic. Eyes:  Without icterus Ears:  Normal auditory acuity. Lungs:  Clear to auscultation bilaterally.  Heart:  S1, S2 present without murmurs appreciated.  Abdomen:  +BS, soft, non-tender and non-distended. No HSM noted. No guarding or rebound. No masses appreciated.  Rectal:  no external hemorrhoids or obvious fissure. Unable to complete DRE due to pain.  Msk:  Symmetrical without gross deformities. Normal posture. Extremities:  Without edema. Neurologic:  Alert and  oriented x4;  grossly normal neurologically. Skin:  Intact without significant lesions or rashes. Psych:  Alert and cooperative. Normal mood and affect.   Assessment   Isaac Lopez is a 62 y.o. male presenting today at the request of [facility], PA-C, due to rectal bleeding.  Symptoms have been present for about 3 months, and he reports knife-like pain with bowel movements. Suspected anal fissure; however, I was unable to do a complete DRE due ot pain. He also has possible mild wall thickening of rectum on CT June 2024. Last colonoscopy over 10 years ago without polyps that he is aware. No FH colon cancer.    Needs diagnostic colonoscopy in near future. Will also treat for possible anal fissure. He will not be able to get this cream for a few  weeks due to pay schedule.   PLAN   Continue stool softeners Miralax daily to keep stool soft Proceed with colonoscopy by Dr. Marletta Lor  in near future: the risks, benefits, and alternatives have been discussed with the patient in detail. The patient states understanding and desires to proceed.  Calling in [facility] cream for presumed fissure: he will not be able to get this until the 25th.   Gelene Mink, PhD, ANP-BC [facility]

## 2022-12-21 NOTE — H&P (View-Only) (Signed)
Gastroenterology Office Note    Referring Provider: Tylene Fantasia., PA-C Primary Care Physician:  Tylene Fantasia., PA-C  Primary GI: Dr. Marletta Lor    Chief Complaint   Chief Complaint  Patient presents with   Rectal Bleeding    Patient here today due to rectal bleeding and swelling on the left side of rectum. Patient says it is bright red blood that he sees when this occurs. He says very painful when he has a bm. Patient says he has been using hemorrhoid cream which has helped some.      History of Present Illness   Isaac Lopez is a 62 y.o. male presenting today at the request of Muse, Rochelle D., PA-C due to rectal bleeding.   Colonoscopy over 10 years ago due to bleeding. Doesn't believe polyps. 10 brothers and sisters, and 1-2 of them have polyps. No FH colon cancer. Father: throat cancer   Symptoms present for about 3 months. Intermittent rectal bleeding. Feels on left side of rectum something swollen and feels painful when trying to pass stool. Takes stool softeners, high fiber. Takes awhile to to stop hurting. Knife-like pain. Worsened with BM. BM about every other day. No straining. Takes stool softener 3 daily. Using preparation H. Feels like he has emptied well after BM. Stool is soft.   No unexplained weight loss or lack of appetite. Takes Nexium OTC just as needed. No dysphagia. Has felt tightness in LLQ and across lower abdomen in past when went to ED.    Hgb 12.3, chronically in 12-14.   IMPRESSION: 1. Possible mild wall thickening in the rectum. Otherwise unremarkable CT appearance of the colon 2. No other acute findings on the current study   Noting chronic RLE swelling and joint pain intermittently. Went to Rheumatology for assessment.   Past Medical History:  Diagnosis Date   Chest pain    Hyperlipemia    Hypertension     Past Surgical History:  Procedure Laterality Date   BUNIONECTOMY Right     Current Outpatient Medications   Medication Sig Dispense Refill   aspirin EC 81 MG tablet Take 81 mg by mouth daily. Swallow whole.     cetirizine (ZYRTEC) 10 MG tablet Take 10 mg by mouth daily.     fenofibrate (TRICOR) 145 MG tablet Take 145 mg by mouth daily.     losartan (COZAAR) 50 MG tablet Take by mouth daily.     OVER THE COUNTER MEDICATION FLUID PILL ONCE PER MORNING PER PATIENT. HE DOES NOT KNOW THE NAME OF IT.     No current facility-administered medications for this visit.    Allergies as of 12/21/2022 - Review Complete 12/21/2022  Allergen Reaction Noted   Other  01/15/2011    Family History  Problem Relation Age of Onset   Diabetes Mother    Cancer Father        throat   Emphysema Sister    Cirrhosis Sister    Thyroid cancer Sister    Arthritis Brother     Social History   Socioeconomic History   Marital status: Single    Spouse name: Not on file   Number of children: Not on file   Years of education: Not on file   Highest education level: Not on file  Occupational History   Not on file  Tobacco Use   Smoking status: Never    Passive exposure: Past   Smokeless tobacco: Never  Vaping Use  Vaping Use: Never used  Substance and Sexual Activity   Alcohol use: No   Drug use: No   Sexual activity: Yes    Birth control/protection: Condom  Other Topics Concern   Not on file  Social History Narrative   Not on file   Social Determinants of Health   Financial Resource Strain: Not on file  Food Insecurity: Not on file  Transportation Needs: Not on file  Physical Activity: Not on file  Stress: Not on file  Social Connections: Not on file  Intimate Partner Violence: Not on file     Review of Systems   Gen: Denies any fever, chills, fatigue, weight loss, lack of appetite.  CV: Denies chest pain, heart palpitations, peripheral edema, syncope.  Resp: Denies shortness of breath at rest or with exertion. Denies wheezing or cough.  GI: Denies dysphagia or odynophagia. Denies  jaundice, hematemesis, fecal incontinence. GU : Denies urinary burning, urinary frequency, urinary hesitancy MS: Denies joint pain, muscle weakness, cramps, or limitation of movement.  Derm: Denies rash, itching, dry skin Psych: Denies depression, anxiety, memory loss, and confusion Heme: Denies bruising, bleeding, and enlarged lymph nodes.   Physical Exam   BP 120/80 (BP Location: Left Arm, Patient Position: Sitting, Cuff Size: Large)   Pulse 88   Temp (!) 97.5 F (36.4 C) (Temporal)   Ht 5\' 9"  (1.753 m)   Wt 213 lb 12.8 oz (97 kg)   BMI 31.57 kg/m  General:   Alert and oriented. Pleasant and cooperative. Well-nourished and well-developed.  Head:  Normocephalic and atraumatic. Eyes:  Without icterus Ears:  Normal auditory acuity. Lungs:  Clear to auscultation bilaterally.  Heart:  S1, S2 present without murmurs appreciated.  Abdomen:  +BS, soft, non-tender and non-distended. No HSM noted. No guarding or rebound. No masses appreciated.  Rectal:  no external hemorrhoids or obvious fissure. Unable to complete DRE due to pain.  Msk:  Symmetrical without gross deformities. Normal posture. Extremities:  Without edema. Neurologic:  Alert and  oriented x4;  grossly normal neurologically. Skin:  Intact without significant lesions or rashes. Psych:  Alert and cooperative. Normal mood and affect.   Assessment   Isaac Lopez is a 62 y.o. male presenting today at the request of [facility], PA-C, due to rectal bleeding.  Symptoms have been present for about 3 months, and he reports knife-like pain with bowel movements. Suspected anal fissure; however, I was unable to do a complete DRE due ot pain. He also has possible mild wall thickening of rectum on CT June 2024. Last colonoscopy over 10 years ago without polyps that he is aware. No FH colon cancer.    Needs diagnostic colonoscopy in near future. Will also treat for possible anal fissure. He will not be able to get this cream for a few  weeks due to pay schedule.   PLAN   Continue stool softeners Miralax daily to keep stool soft Proceed with colonoscopy by Dr. Marletta Lor  in near future: the risks, benefits, and alternatives have been discussed with the patient in detail. The patient states understanding and desires to proceed.  Calling in [facility] cream for presumed fissure: he will not be able to get this until the 25th.   Gelene Mink, PhD, ANP-BC [facility]

## 2022-12-23 LAB — QUANTIFERON-TB GOLD PLUS (RQFGPL)
QuantiFERON Mitogen Value: >10 IU/mL
QuantiFERON Nil Value: 0.06 IU/mL
QuantiFERON TB1 Ag Value: 0.05 IU/mL
QuantiFERON TB2 Ag Value: 0.07 IU/mL

## 2022-12-23 LAB — QUANTIFERON-TB GOLD PLUS: QuantiFERON-TB Gold Plus: NEGATIVE

## 2022-12-23 NOTE — Progress Notes (Signed)
Office Visit Note  Patient: Isaac Lopez             Date of Birth: Oct 28, 1960           MRN: [id number]             PCP: Tylene Fantasia., PA-C Referring: Vickki Hearing, MD Visit Date: 01/06/2023 Occupation: @GUAROCC @  Subjective:  Joint pain and swelling  History of Present Illness: Kewon Roering is a 62 y.o. male returns today after his initial consultation on December 16, 2022.  Patient states since the last visit knee joint pain has improved.  He has not had any prednisone or steroid injections.  He continues to have some pain and discomfort in his right ankle joint which she relates to previous injury.  None of the other joints have been painful or swollen per patient.  He denies any history of Planter fasciitis or Achilles tendinitis.  He continues to have intermittent bleeding per rectum.  He denies any history of diarrhea.  He has colonoscopy scheduled next week.  Blood in stool improved, colonoscopy next week RKJ intermittent swelling RAJ stay [place], h/o injury 2006 Cherries help Discussed the option of SSZ , pt declined Avoid NSAIDS -high Cr FU in 3 mths after colonoscopy  Activities of Daily Living:  Patient reports morning stiffness for 10 minutes.   Patient Reports nocturnal pain.  Difficulty dressing/grooming: Denies Difficulty climbing stairs: Denies Difficulty getting out of chair: Denies Difficulty using hands for taps, buttons, cutlery, and/or writing: Denies  Review of Systems  Constitutional:  Positive for fatigue.  HENT:  Positive for hearing loss. Negative for mouth sores and mouth dryness.   Eyes:  Negative for dryness.  Respiratory:  Negative for shortness of breath.   Cardiovascular:  Negative for chest pain and palpitations.  Gastrointestinal:  Positive for blood in stool. Negative for constipation and diarrhea.  Endocrine: Negative for increased urination.  Genitourinary:  Negative for involuntary urination.  Musculoskeletal:  Positive for joint  pain, joint pain, joint swelling and morning stiffness. Negative for gait problem, myalgias, muscle weakness, muscle tenderness and myalgias.  Skin:  Negative for color change, rash and sensitivity to sunlight.  Allergic/Immunologic: Negative for susceptible to infections.  Neurological:  Negative for dizziness and headaches.  Hematological:  Negative for swollen glands.  Psychiatric/Behavioral:  Negative for depressed mood and sleep disturbance. The patient is not nervous/anxious.     PMFS History:  Patient Active Problem List   Diagnosis Date Noted   Rectal pain 12/21/2022   Disc disease, degenerative, cervical 10/26/2020   Eczematoid otitis externa 10/26/2020   Erectile dysfunction 10/26/2020   Essential hypertension 10/26/2020   Thrombocytopenia (HCC) 10/26/2020   Biliary sludge determined by ultrasound 12/02/2014   Right-sided chest pain 12/02/2014    Past Medical History:  Diagnosis Date   Chest pain    Hyperlipemia    Hypertension     Family History  Problem Relation Age of Onset   Diabetes Mother    Cancer Father        throat   Emphysema Sister    Cirrhosis Sister    Thyroid cancer Sister    Arthritis Brother    Past Surgical History:  Procedure Laterality Date   BUNIONECTOMY Right    Social History   Social History Narrative   Not on file    There is no immunization history on file for this patient.   Objective: Vital Signs: BP 114/75 (BP Location: Left Arm, Patient Position: Sitting,  Cuff Size: Normal)   Pulse 82   Resp 15   Ht 5\' 9"  (1.753 m)   Wt 213 lb 3.2 oz (96.7 kg)   BMI 31.48 kg/m    Physical Exam Vitals and nursing note reviewed.  Constitutional:      Appearance: He is well-developed.  HENT:     Head: Normocephalic and atraumatic.  Eyes:     Conjunctiva/sclera: Conjunctivae normal.     Pupils: Pupils are equal, round, and reactive to light.  Cardiovascular:     Rate and Rhythm: Normal rate and regular rhythm.     Heart sounds:  Normal heart sounds.  Pulmonary:     Effort: Pulmonary effort is normal.     Breath sounds: Normal breath sounds.  Abdominal:     General: Bowel sounds are normal.     Palpations: Abdomen is soft.  Musculoskeletal:     Cervical back: Normal range of motion and neck supple.  Skin:    General: Skin is warm and dry.     Capillary Refill: Capillary refill takes less than 2 seconds.  Neurological:     Mental Status: He is alert and oriented to person, place, and time.  Psychiatric:        Behavior: Behavior normal.      Musculoskeletal Exam: Cervical, thoracic and lumbar spine were in good range of motion.  He had mild tenderness in the lower lumbar region.  He had no SI joint tenderness.  Shoulder joints, elbow joints, wrist joints, MCPs PIPs and DIPs were in good range of motion with no synovitis.  Bilateral PIP and DIP thickening was noted.  Hip joints and knee joints were in good range of motion.  He had fullness in his right knee joint with some warmth.  Left knee joint was in full range of motion without any warmth swelling or effusion.  He had thickening of his right ankle joint which was warm on palpation.  No Achilles tendinitis of Planter fasciitis was noted.    CDAI Exam: CDAI Score: -- Patient Global: --; Provider Global: -- Swollen: --; Tender: -- Joint Exam 01/06/2023   No joint exam has been documented for this visit   There is currently no information documented on the homunculus. Go to the Rheumatology activity and complete the homunculus joint exam.  Investigation: No additional findings.  Imaging: XR Ankle 2 Views Right  Result Date: 12/18/2022 No tibiotalar narrowing was noted.  Subtalar narrowing was noted.  No intertarsal joint space narrowing was noted.  Pins were noted in the first MTP joint.  Soft tissue swelling was noted.  Radiodensity noted on the lateral aspect of the distal tibia suggesting possible calcified intraosseous membrane. Impression: Subtalar  joint space narrowing was noted.  Soft tissue swelling was noted.  XR KNEE 3 VIEW RIGHT  Result Date: 12/16/2022 Moderate medial compartment narrowing was noted.  Severe patellofemoral narrowing was noted.  No chondrocalcinosis was noted. Impression: These findings are suggestive of moderate osteoarthritis and severe chondromalacia patella.  XR Lumbar Spine 2-3 Views  Result Date: 12/16/2022 Dextroscoliosis was noted.  Multilevel spondylosis with most significant narrowing between L2-L3 and L3-L4 was noted.  Anterior spurring was noted.  Facet joint arthropathy with foraminal narrowing was noted. Impression: These findings are suggestive of degenerative disc disease of the lumbar spine and facet joint arthropathy   Recent Labs: Lab Results  Component Value Date   WBC 5.5 12/16/2022   HGB 12.3 (L) 12/16/2022   HGB 12.6 (L) 12/16/2022  PLT 183 12/16/2022   NA 135 12/16/2022   K 3.3 (L) 12/16/2022   CL 102 12/16/2022   CO2 24 12/16/2022   GLUCOSE 82 12/16/2022   BUN 15 12/16/2022   CREATININE 1.37 (H) 12/16/2022   BILITOT 0.8 12/16/2022   ALKPHOS 67 12/16/2022   AST 21 12/16/2022   ALT 17 12/16/2022   PROT 7.5 12/16/2022   ALBUMIN 4.0 12/16/2022   CALCIUM 9.3 12/16/2022   GFRAA >60 12/08/2019   QFTBGOLDPLUS Negative 12/16/2022   December 16, 2022 SPEP normal, immunoglobulins normal, G6PD 12.6,  ANA negative, RF negative, anti-CCP negative, ESR 10, uric acid 5.9, ACE 53, hepatitis B-, hepatitis C negative  Speciality Comments: No specialty comments available.  Procedures:  No procedures performed Allergies: Other   Assessment / Plan:     Visit Diagnoses: Pain and swelling of right knee - Recurrent swelling of right knee joint since April 2023.  Aspirations x 3.  Synovial fluid mild inflammatory and crystals were negative.  Patient states that the swelling responded to prednisone taper.  Labs obtained during the last visit ANA was negative, RF negative, anti-CCP antibody negative,  uric acid 5.9, ACE 53, sed rate normal.  All autoimmune labs were within normal limits.  I had a detailed discussion with the patient regarding the autoimmune labs.  I discussed the possibility of seronegative rheumatoid arthritis or reactive arthritis.  I also discussed possible trial of sulfasalazine.  Indications side effects contraindications of sulfasalazine were discussed at length.  G6PD was normal.  Patient would like to hold off immunosuppressive therapy.  Patient states his knee joint is doing better and he will contact me if his symptoms get worse.  Colonoscopy is also pending at this point.  We would like to see the results of the colonoscopy.  I advised him to contact me if his symptoms get worse.  Primary osteoarthritis of right knee - X-rays showed moderate osteoarthritis and severe chondromalacia patella.  Right ankle swelling - Recurrent swelling of right ankle since May 2023.  Treated with NSAIDs in the past.  He continues to have some warmth in his right knee joint.  Patient states that he had injury to his right ankle in 2005 and since then his ankle has never felt normal.  He had subtalar joint space narrowing on the radiographic exam.  No Achilles tendinitis of Planter fasciitis was noted.  Disc disease, degenerative, cervical -he had good range of motion of the cervical spine without any discomfort.  He gives history of intermittent discomfort in his cervical spine.  X-rays from 2021 showed degenerative changes and facet joint arthropathy.  DDD (degenerative disc disease), lumbar -he continues to have some localized lower back pain off and on.  X-rays showed multilevel spondylosis with severe L3-4 narrowing and facet joint arthropathy.  Essential hypertension-blood pressure was normal at 114/75 today.  Stage 3a chronic kidney disease (HCC)-creatinine is elevated at 1.37.  Avoidance of NSAIDs was discussed.  Thrombocytopenia (HCC)-he had thrombocytopenia in the past.  Most recent  labs from December 16, 2022 showed normal platelets.  Rectal bleeding -patient has been noticing intermittent rectal bleeding.  He denies any history of diarrhea.  Colonoscopy is scheduled to rule out IBD.   Orders: No orders of the defined types were placed in this encounter.  No orders of the defined types were placed in this encounter.    Follow-Up Instructions: Return in about 3 months (around 04/08/2023) for inflammatory arthritis.   Pollyann Savoy, MD  Note - This  record has been created using [employer].  Chart creation errors have been sought, but may not always  have been located. Such creation errors do not reflect on  the standard of medical care.

## 2022-12-25 NOTE — Progress Notes (Signed)
TB Gold negative

## 2022-12-27 ENCOUNTER — Ambulatory Visit: Payer: Self-pay | Admitting: Gastroenterology

## 2022-12-30 ENCOUNTER — Telehealth: Payer: Self-pay | Admitting: Gastroenterology

## 2022-12-30 NOTE — Telephone Encounter (Signed)
Dena:  Please call [facility] and ask to speak to compounding pharmacy.   We need called in the [facility] hemorrhoid cream: needs to be compounded with diltiazem and add Lidocaine, I usually talk to [place], the pharmacist. Apply 4 times a day, disp #1 tube with 1 refill. Patient is unable to pick up until the 25th due to pay schedule.

## 2023-01-02 NOTE — Telephone Encounter (Signed)
noted 

## 2023-01-02 NOTE — Telephone Encounter (Signed)
Rx was called in and they are aware pt will pick up on 25th due to pay schedule.

## 2023-01-06 ENCOUNTER — Encounter: Payer: Self-pay | Admitting: Rheumatology

## 2023-01-06 ENCOUNTER — Ambulatory Visit: Payer: Medicaid Other | Attending: Rheumatology | Admitting: Rheumatology

## 2023-01-06 VITALS — BP 114/75 | HR 82 | Resp 15 | Ht 69.0 in | Wt 213.2 lb

## 2023-01-06 DIAGNOSIS — M1711 Unilateral primary osteoarthritis, right knee: Secondary | ICD-10-CM

## 2023-01-06 DIAGNOSIS — M25561 Pain in right knee: Secondary | ICD-10-CM | POA: Diagnosis not present

## 2023-01-06 DIAGNOSIS — M5030 Other cervical disc degeneration, unspecified cervical region: Secondary | ICD-10-CM

## 2023-01-06 DIAGNOSIS — K625 Hemorrhage of anus and rectum: Secondary | ICD-10-CM

## 2023-01-06 DIAGNOSIS — N1831 Chronic kidney disease, stage 3a: Secondary | ICD-10-CM

## 2023-01-06 DIAGNOSIS — I10 Essential (primary) hypertension: Secondary | ICD-10-CM

## 2023-01-06 DIAGNOSIS — D696 Thrombocytopenia, unspecified: Secondary | ICD-10-CM

## 2023-01-06 DIAGNOSIS — M25471 Effusion, right ankle: Secondary | ICD-10-CM | POA: Diagnosis not present

## 2023-01-06 DIAGNOSIS — M5136 Other intervertebral disc degeneration, lumbar region: Secondary | ICD-10-CM

## 2023-01-06 DIAGNOSIS — M25461 Effusion, right knee: Secondary | ICD-10-CM

## 2023-01-06 DIAGNOSIS — Z79899 Other long term (current) drug therapy: Secondary | ICD-10-CM

## 2023-01-11 ENCOUNTER — Emergency Department (HOSPITAL_COMMUNITY)
Admission: EM | Admit: 2023-01-11 | Discharge: 2023-01-11 | Disposition: A | Discharge status: Home / Self Care | Payer: Medicaid Other | Attending: Emergency Medicine | Admitting: Emergency Medicine

## 2023-01-11 ENCOUNTER — Other Ambulatory Visit: Payer: Self-pay

## 2023-01-11 ENCOUNTER — Encounter (HOSPITAL_COMMUNITY): Payer: Self-pay | Admitting: Emergency Medicine

## 2023-01-11 DIAGNOSIS — H938X2 Other specified disorders of left ear: Secondary | ICD-10-CM | POA: Diagnosis present

## 2023-01-11 DIAGNOSIS — H6122 Impacted cerumen, left ear: Secondary | ICD-10-CM | POA: Diagnosis not present

## 2023-01-11 DIAGNOSIS — Z7982 Long term (current) use of aspirin: Secondary | ICD-10-CM | POA: Diagnosis not present

## 2023-01-11 DIAGNOSIS — Z79899 Other long term (current) drug therapy: Secondary | ICD-10-CM | POA: Insufficient documentation

## 2023-01-11 MED ORDER — CARBAMIDE PEROXIDE 6.5 % OT SOLN
10.0000 [drp] | Freq: Once | OTIC | Status: AC
  Administered 2023-01-11: 10 [drp] via OTIC
  Filled 2023-01-11: qty 15

## 2023-01-11 NOTE — Discharge Instructions (Signed)
Follow-up with your doctor if needed

## 2023-01-11 NOTE — ED Notes (Signed)
Ear irrigated with 30 ml NS- large amount of wax particles noted. PA aware.

## 2023-01-11 NOTE — ED Triage Notes (Signed)
Pt c/o left ear fullness x 1 week and now "just about unable to hear out of it". A/o. Amulatory with steady gait. Denies dizziness. C/o pain

## 2023-01-13 ENCOUNTER — Other Ambulatory Visit (HOSPITAL_COMMUNITY)
Admission: RE | Admit: 2023-01-13 | Discharge: 2023-01-13 | Disposition: A | Discharge status: Home / Self Care | Payer: Medicaid Other | Source: Ambulatory Visit | Attending: Internal Medicine | Admitting: Internal Medicine

## 2023-01-13 DIAGNOSIS — K6289 Other specified diseases of anus and rectum: Secondary | ICD-10-CM | POA: Diagnosis not present

## 2023-01-13 LAB — BASIC METABOLIC PANEL
Anion gap: 8 (ref 5–15)
BUN: 20 mg/dL (ref 8–23)
CO2: 26 mmol/L (ref 22–32)
Calcium: 9.2 mg/dL (ref 8.9–10.3)
Chloride: 102 mmol/L (ref 98–111)
Creatinine, Ser: 1.56 mg/dL — ABNORMAL HIGH (ref 0.61–1.24)
GFR, Estimated: 50 mL/min — ABNORMAL LOW (ref >60)
Glucose, Bld: 103 mg/dL — ABNORMAL HIGH (ref 70–99)
Potassium: 3.7 mmol/L (ref 3.5–5.1)
Sodium: 136 mmol/L (ref 135–145)

## 2023-01-14 NOTE — ED Provider Notes (Signed)
[place] EMERGENCY DEPARTMENT AT [facility] Provider Note   CSN: [id number] Arrival date & time: 01/11/23  1208     History  Chief Complaint  Patient presents with   Ear Fullness    Isaac Lopez is a 62 y.o. male.   Ear Fullness Pertinent negatives include no chest pain, no headaches and no shortness of breath.       Isaac Lopez is a 62 y.o. male who presents to the Emergency Department complaining of fullness and decreased hearing of the left ear.  Symptoms  x 1 week.  Denies facial pain, dizziness, neck pain and headache  Home Medications Prior to Admission medications   Medication Sig Start Date End Date Taking? Authorizing Provider  albuterol (VENTOLIN HFA) 108 (90 Base) MCG/ACT inhaler Inhale 1-2 puffs into the lungs every 6 (six) hours as needed for wheezing or shortness of breath.    [provider]  aspirin EC 81 MG tablet Take 81 mg by mouth in the morning. Swallow whole.    [provider]  cetirizine (ZYRTEC) 10 MG tablet Take 10 mg by mouth in the morning.    [provider]  fenofibrate (TRICOR) 145 MG tablet Take 72.5 mg by mouth in the morning.    [provider]  hydrochlorothiazide (HYDRODIURIL) 25 MG tablet Take 25 mg by mouth in the morning.    [provider]  losartan (COZAAR) 50 MG tablet Take 50 mg by mouth in the morning.    [provider]      Allergies    Other    Review of Systems   Review of Systems  Constitutional:  Negative for appetite change, chills and fever.  HENT:  Positive for ear pain and hearing loss.   Respiratory:  Negative for shortness of breath.   Cardiovascular:  Negative for chest pain.  Gastrointestinal:  Negative for nausea and vomiting.  Neurological:  Negative for dizziness, facial asymmetry, speech difficulty and headaches.  Psychiatric/Behavioral:  Negative for agitation and confusion.     Physical Exam Updated Vital Signs BP 135/78   Pulse 78    Temp 98 F (36.7 C)   Resp 17   SpO2 96%  Physical Exam Vitals and nursing note reviewed.  Constitutional:      General: He is not in acute distress.    Appearance: Normal appearance. He is not ill-appearing or toxic-appearing.  HENT:     Right Ear: Tympanic membrane and ear canal normal.     Ears:     Comments: Large amt of cerumen to left ear canal.  TM obscured due to cerumen    Mouth/Throat:     Mouth: Mucous membranes are moist.     Pharynx: Oropharynx is clear.  Cardiovascular:     Rate and Rhythm: Normal rate and regular rhythm.  Pulmonary:     Effort: Pulmonary effort is normal. No respiratory distress.  Musculoskeletal:        General: Normal range of motion.     Cervical back: No rigidity or tenderness.  Skin:    General: Skin is warm.     Capillary Refill: Capillary refill takes less than 2 seconds.  Neurological:     General: No focal deficit present.     Mental Status: He is alert.     Sensory: No sensory deficit.     Motor: No weakness.     ED Results / Procedures / Treatments   Labs (all labs ordered are listed, but only  abnormal results are displayed) Labs Reviewed - No data to display  EKG None  Radiology No results found.  Procedures Procedures    Medications Ordered in ED Medications  carbamide peroxide (DEBROX) 6.5 % OTIC (EAR) solution 10 drop (10 drops Left EAR Given 01/11/23 1555)    ED Course/ Medical Decision Making/ A&P                             Medical Decision Making Pt here with left ear pain/fullness for 1 week.  Decreased hearing.  No headache or dizziness  Amount and/or Complexity of Data Reviewed Discussion of management or test interpretation with external provider(s): Debrox applied to ear canal, large amt of wax removed.  On recheck, still a small amt of wax remains, but pt reports feeling better and hearing restored.  States that he needs to leave will continue debrox as needed.    Risk OTC  drugs.           Final Clinical Impression(s) / ED Diagnoses Final diagnoses:  Impacted cerumen of left ear    Rx / DC Orders ED Discharge Orders     None         Rosey Bath 01/14/23 1237    Linwood Dibbles, MD 01/18/23 [phone]

## 2023-01-16 NOTE — OR Nursing (Signed)
Kiel aware creatine 1.56

## 2023-01-17 ENCOUNTER — Ambulatory Visit (HOSPITAL_BASED_OUTPATIENT_CLINIC_OR_DEPARTMENT_OTHER): Payer: Medicaid Other | Admitting: Certified Registered Nurse Anesthetist

## 2023-01-17 ENCOUNTER — Ambulatory Visit (HOSPITAL_COMMUNITY)
Admission: RE | Admit: 2023-01-17 | Discharge: 2023-01-17 | Disposition: A | Discharge status: Home / Self Care | Payer: Medicaid Other | Attending: Internal Medicine | Admitting: Internal Medicine

## 2023-01-17 ENCOUNTER — Encounter (HOSPITAL_COMMUNITY)
Admission: RE | Disposition: A | Discharge status: Home / Self Care | Payer: Self-pay | Source: Home / Self Care | Attending: Internal Medicine

## 2023-01-17 ENCOUNTER — Ambulatory Visit (HOSPITAL_COMMUNITY): Payer: Medicaid Other | Admitting: Certified Registered Nurse Anesthetist

## 2023-01-17 ENCOUNTER — Other Ambulatory Visit: Payer: Self-pay

## 2023-01-17 ENCOUNTER — Encounter (HOSPITAL_COMMUNITY): Payer: Self-pay

## 2023-01-17 DIAGNOSIS — K625 Hemorrhage of anus and rectum: Secondary | ICD-10-CM

## 2023-01-17 DIAGNOSIS — Q438 Other specified congenital malformations of intestine: Secondary | ICD-10-CM | POA: Insufficient documentation

## 2023-01-17 DIAGNOSIS — Z7722 Contact with and (suspected) exposure to environmental tobacco smoke (acute) (chronic): Secondary | ICD-10-CM | POA: Insufficient documentation

## 2023-01-17 DIAGNOSIS — K648 Other hemorrhoids: Secondary | ICD-10-CM | POA: Diagnosis not present

## 2023-01-17 DIAGNOSIS — Z8379 Family history of other diseases of the digestive system: Secondary | ICD-10-CM | POA: Insufficient documentation

## 2023-01-17 DIAGNOSIS — K6289 Other specified diseases of anus and rectum: Secondary | ICD-10-CM

## 2023-01-17 DIAGNOSIS — K602 Anal fissure, unspecified: Secondary | ICD-10-CM | POA: Diagnosis not present

## 2023-01-17 DIAGNOSIS — K649 Unspecified hemorrhoids: Secondary | ICD-10-CM

## 2023-01-17 DIAGNOSIS — D126 Benign neoplasm of colon, unspecified: Secondary | ICD-10-CM

## 2023-01-17 DIAGNOSIS — D120 Benign neoplasm of cecum: Secondary | ICD-10-CM | POA: Diagnosis not present

## 2023-01-17 HISTORY — PX: COLONOSCOPY WITH PROPOFOL: SHX5780

## 2023-01-17 HISTORY — PX: POLYPECTOMY: SHX5525

## 2023-01-17 SURGERY — COLONOSCOPY WITH PROPOFOL
Anesthesia: General

## 2023-01-17 MED ORDER — LACTATED RINGERS IV SOLN: INTRAVENOUS | Status: DC

## 2023-01-17 MED ORDER — PROPOFOL 10 MG/ML IV BOLUS
INTRAVENOUS | Status: DC | PRN
Start: 2023-01-17 — End: 2023-01-17
  Administered 2023-01-17: 100 mg via INTRAVENOUS

## 2023-01-17 MED ORDER — PROPOFOL 500 MG/50ML IV EMUL
INTRAVENOUS | Status: DC | PRN
  Administered 2023-01-17: 150 ug/kg/min via INTRAVENOUS

## 2023-01-17 NOTE — Transfer of Care (Signed)
Immediate Anesthesia Transfer of Care Note  Patient: Isaac Lopez  Procedure(s) Performed: COLONOSCOPY WITH PROPOFOL POLYPECTOMY  Patient Location: Endoscopy Unit  Anesthesia Type:General  Level of Consciousness: awake  Airway & Oxygen Therapy: Patient Spontanous Breathing  Post-op Assessment: Report given to RN and Post -op Vital signs reviewed and stable  Post vital signs: Reviewed and stable  Last Vitals:  Vitals Value Taken Time  BP 95/56 01/17/23 1250  Temp 36.4 C 01/17/23 1250  Pulse 80 01/17/23 1250  Resp 21 01/17/23 1250  SpO2 93%     Last Pain:  Vitals:   01/17/23 1250  TempSrc: Axillary  PainSc: 0-No pain      Patients Stated Pain Goal: 4 (01/17/23 1114)  Complications: No notable events documented.

## 2023-01-17 NOTE — Op Note (Signed)
[facility] Patient Name: Isaac Lopez Procedure Date: 01/17/2023 11:47 AM MRN: [id number] Date of Birth: 1960/09/02 Attending MD: Hennie Duos. Marletta Lor , [place], [id number] CSN: [id number] Age: 62 Admit Type: Outpatient Procedure:                Colonoscopy Indications:              Rectal bleeding, Rectal pain Providers:                Hennie Duos. Marletta Lor, DO, Buel Ream. Thomasena Edis RN, RN,                            Elinor Parkinson, Lennice Sites Technician, Technician Referring MD:              Medicines:                See the Anesthesia note for documentation of the                            administered medications Complications:            No immediate complications. Estimated Blood Loss:     Estimated blood loss was minimal. Procedure:                Pre-Anesthesia Assessment:                           - The anesthesia plan was to use monitored                            anesthesia care (MAC).                           After obtaining informed consent, the colonoscope                            was passed under direct vision. Throughout the                            procedure, the patient's blood pressure, pulse, and                            oxygen saturations were monitored continuously. The                            [id number]-HQ190L ([id number]) scope was introduced through                            the anus and advanced to the the cecum, identified                            by appendiceal orifice and ileocecal valve. The                            colonoscopy was technically difficult and complex                            due  to a redundant colon and significant looping.                            Successful completion of the procedure was aided by                            applying abdominal pressure. The patient tolerated                            the procedure well. The quality of the bowel                            preparation was evaluated using the BBPS [facility]                             Bowel Preparation Scale) with scores of: Right                            Colon = 2 (minor amount of residual staining, small                            fragments of stool and/or opaque liquid, but mucosa                            seen well), Transverse Colon = 2 (minor amount of                            residual staining, small fragments of stool and/or                            opaque liquid, but mucosa seen well) and Left Colon                            = 2 (minor amount of residual staining, small                            fragments of stool and/or opaque liquid, but mucosa                            seen well). The total BBPS score equals 6. Fair. Scope In: 12:29:13 PM Scope Out: 12:46:52 PM Scope Withdrawal Time: 0 hours 14 minutes 7 seconds  Total Procedure Duration: 0 hours 17 minutes 39 seconds  Findings:      An anal fissure was found on perianal exam.      Non-bleeding internal hemorrhoids were found during retroflexion.      An 8 mm polyp was found in the cecum. The polyp was sessile. The polyp       was removed with a cold snare. Resection and retrieval were complete.      A moderate amount of stool was found in the entire colon, making       visualization difficult. Lavage of the area was performed using copious       amounts of sterile water, resulting in clearance with  fair visualization. Impression:               - Anal fissure found on perianal exam.                           - Non-bleeding internal hemorrhoids.                           - One 8 mm polyp in the cecum, removed with a cold                            snare. Resected and retrieved.                           - Stool in the entire examined colon. Moderate Sedation:      Per Anesthesia Care Recommendation:           - Patient has a contact number available for                            emergencies. The signs and symptoms of potential                            delayed complications were  discussed with the                            patient. Return to normal activities tomorrow.                            Written discharge instructions were provided to the                            patient.                           - Resume previous diet.                           - Continue present medications.                           - Await pathology results.                           - Repeat colonoscopy in 5 years for surveillance                            and borderline prep. Recommend 2 day extended colon                            prep                           - Return to GI clinic in 6 weeks.                           - Miralax 1 capful (17  grams) in 8 ounces of water                            PO daily.                           - Patient told to go pick up his topical NTG cream                            from pharmacy Procedure Code(s):        --- Professional ---                           [phone], Colonoscopy, flexible; with removal of                            tumor(s), polyp(s), or other lesion(s) by snare                            technique Diagnosis Code(s):        --- Professional ---                           K60.2, Anal fissure, unspecified                           D12.0, Benign neoplasm of cecum                           K64.8, Other hemorrhoids                           K62.5, Hemorrhage of anus and rectum                           K62.89, Other specified diseases of anus and rectum CPT copyright 2022 American Medical Association. All rights reserved. The codes documented in this report are preliminary and upon coder review may  be revised to meet current compliance requirements. Hennie Duos. Marletta Lor, DO Hennie Duos. Marletta Lor, DO 01/17/2023 12:50:48 PM This report has been signed electronically. Number of Addenda: 0

## 2023-01-17 NOTE — Interval H&P Note (Signed)
History and Physical Interval Note:  01/17/2023 11:19 AM  Isaac Lopez  has presented today for surgery, with the diagnosis of rectal pain/bleeding.  The various methods of treatment have been discussed with the patient and family. After consideration of risks, benefits and other options for treatment, the patient has consented to  Procedure(s) with comments: COLONOSCOPY WITH PROPOFOL (N/A) - 1:30pm;asa 2 as a surgical intervention.  The patient's history has been reviewed, patient examined, no change in status, stable for surgery.  I have reviewed the patient's chart and labs.  Questions were answered to the patient's satisfaction.     Lanelle Bal

## 2023-01-17 NOTE — Anesthesia Preprocedure Evaluation (Signed)

## 2023-01-17 NOTE — Discharge Instructions (Addendum)
  Colonoscopy Discharge Instructions  Read the instructions outlined below and refer to this sheet in the next few weeks. These discharge instructions provide you with general information on caring for yourself after you leave the hospital. Your doctor may also give you specific instructions. While your treatment has been planned according to the most current medical practices available, unavoidable complications occasionally occur.   ACTIVITY You may resume your regular activity, but move at a slower pace for the next 24 hours.  Take frequent rest periods for the next 24 hours.  Walking will help get rid of the air and reduce the bloated feeling in your belly (abdomen).  No driving for 24 hours (because of the medicine (anesthesia) used during the test).   Do not sign any important legal documents or operate any machinery for 24 hours (because of the anesthesia used during the test).  NUTRITION Drink plenty of fluids.  You may resume your normal diet as instructed by your doctor.  Begin with a light meal and progress to your normal diet. Heavy or fried foods are harder to digest and may make you feel sick to your stomach (nauseated).  Avoid alcoholic beverages for 24 hours or as instructed.  MEDICATIONS You may resume your normal medications unless your doctor tells you otherwise.  WHAT YOU CAN EXPECT TODAY Some feelings of bloating in the abdomen.  Passage of more gas than usual.  Spotting of blood in your stool or on the toilet paper.  IF YOU HAD POLYPS REMOVED DURING THE COLONOSCOPY: No aspirin products for 7 days or as instructed.  No alcohol for 7 days or as instructed.  Eat a soft diet for the next 24 hours.  FINDING OUT THE RESULTS OF YOUR TEST Not all test results are available during your visit. If your test results are not back during the visit, make an appointment with your caregiver to find out the results. Do not assume everything is normal if you have not heard from your  caregiver or the medical facility. It is important for you to follow up on all of your test results.  SEEK IMMEDIATE MEDICAL ATTENTION IF: You have more than a spotting of blood in your stool.  Your belly is swollen (abdominal distention).  You are nauseated or vomiting.  You have a temperature over 101.  You have abdominal pain or discomfort that is severe or gets worse throughout the day.   Your colonoscopy revealed 1 polyp(s) which I removed successfully. Await pathology results, my office will contact you. I recommend repeating colonoscopy in 5 years for surveillance purposes.   Overall, your colon appeared very healthy.  I did not see any active inflammation indicative of underlying inflammatory bowel disease such as Crohn's disease or ulcerative colitis throughout your colon.   On rectal exam, you do have an anal fissure.  Recommend you go pick up the topical nitroglycerin cream from [employer].  Keep bowel movements soft, use MiraLAX 1 capful daily as needed.  Follow-up in 6 weeks.   I hope you have a great rest of your week!  Hennie Duos. Marletta Lor, D.O. Gastroenterology and Hepatology [facility]

## 2023-01-19 NOTE — Telephone Encounter (Signed)
Error

## 2023-01-20 ENCOUNTER — Encounter (HOSPITAL_COMMUNITY): Payer: Self-pay | Admitting: Internal Medicine

## 2023-01-20 NOTE — Anesthesia Postprocedure Evaluation (Signed)
Anesthesia Post Note  Patient: Isaac Lopez  Procedure(s) Performed: COLONOSCOPY WITH PROPOFOL POLYPECTOMY  Patient location during evaluation: Phase II Anesthesia Type: General Level of consciousness: awake Pain management: pain level controlled Vital Signs Assessment: post-procedure vital signs reviewed and stable Respiratory status: spontaneous breathing and respiratory function stable Cardiovascular status: blood pressure returned to baseline and stable Postop Assessment: no headache and no apparent nausea or vomiting Anesthetic complications: no Comments: Late entry   No notable events documented.   Last Vitals:  Vitals:   01/17/23 1114 01/17/23 1250  BP: 118/80 (!) 95/56  Pulse: 71 80  Resp: 18 (!) 21  Temp: 36.7 C 36.4 C  SpO2: 93% 93%    Last Pain:  Vitals:   01/17/23 1250  TempSrc: Axillary  PainSc: 0-No pain                 Windell Norfolk

## 2023-01-23 ENCOUNTER — Telehealth: Payer: Self-pay | Admitting: Gastroenterology

## 2023-01-23 ENCOUNTER — Telehealth: Payer: Self-pay

## 2023-01-23 NOTE — Telephone Encounter (Signed)
Patient stopped by office and said that he does want the prescription for the "compound".  At first he was unsure if he wanted it but now he does.

## 2023-01-23 NOTE — Telephone Encounter (Signed)
Returned the pt's call and was advised by him that back in July he never went to [facility] to pick this medication up. He stated that after his colonoscopy he was advised he needed to use it. He is still having pain and needs something sent in for him. Please advise

## 2023-01-23 NOTE — Telephone Encounter (Signed)
Completed a hospital f/u nurse case management  call lon 7.25.24 as a result of the pt being seen at the [facility] ER on 7.24.24.  I  Successully spoke with patient.    Pt  states he is doing well since his ER visit at [facility].  States his ear is doing much better since his ear treatment has been completed.  States his ear hearing has returned back to normal.    States he has no additional needs at this time.    PLAN    Pt  seems to stay connected with his PCP at [facility] Dept  Pt was re-educated on the use of Urgent vs Emergency room in the event his PCP office is not available or open for business.    Urgent vs emergency conditions were also reviewed with the patient.    Pt states he understood and advised to contact me back if she has additional questions/concerns  at  [phone]) and  call ended

## 2023-01-24 ENCOUNTER — Telehealth: Payer: Self-pay

## 2023-01-24 NOTE — Telephone Encounter (Signed)
Wellness check in was completed with patient on today.    Pt stays connected with his PCP often at [facility] with a next scheduled PCP appt 12.5.24.  Pt  denies any immediate socio-determinant needs as it relates to accessing medical care.  However pt expressed, medical concerns about his left ear condition and how  it has improved as far as hearing, but now he is experiencing some pain in the ear.  States he did follow the treatment plan from ER of [facility], by [facility] his debrox ear drops and suctioning as recommended    PLAN  1.  Pt was advised on basic first aide of OTC ibuprofen to assist with symptoms of pain        and swelling to get some relief and to take as directed on the bottle.  2.  If ibuprofen does resolve the issue, contact his PCP at [facility] dept to assist with further medical treatment options.     Pt stated he understood the instructions accordingly and call then ended

## 2023-01-25 NOTE — Telephone Encounter (Signed)
Noted  Phoned and LMOVM for the pt regarding he needs to go or call [employer] and advise that he wants this Rx. Also contacted the pt by his MyChart and let him know the same thing

## 2023-01-25 NOTE — Telephone Encounter (Signed)
Dena, this was already called in. He just needs to call [employer] and tell them that he would like to have his prescription filled. They do not make it until they have spoken with patient and confirmed pricing.

## 2023-02-20 ENCOUNTER — Emergency Department (HOSPITAL_COMMUNITY)
Admission: EM | Admit: 2023-02-20 | Discharge: 2023-02-20 | Disposition: A | Discharge status: Home / Self Care | Payer: Medicaid Other | Attending: Emergency Medicine | Admitting: Emergency Medicine

## 2023-02-20 ENCOUNTER — Encounter (HOSPITAL_COMMUNITY): Payer: Self-pay

## 2023-02-20 ENCOUNTER — Other Ambulatory Visit: Payer: Self-pay

## 2023-02-20 DIAGNOSIS — H9201 Otalgia, right ear: Secondary | ICD-10-CM | POA: Diagnosis present

## 2023-02-20 DIAGNOSIS — Z7982 Long term (current) use of aspirin: Secondary | ICD-10-CM | POA: Diagnosis not present

## 2023-02-20 DIAGNOSIS — H6121 Impacted cerumen, right ear: Secondary | ICD-10-CM | POA: Diagnosis not present

## 2023-02-20 MED ORDER — CARBAMIDE PEROXIDE 6.5 % OT SOLN: 5.0000 [drp] | Freq: Two times a day (BID) | OTIC | 0 refills | Status: DC

## 2023-02-20 NOTE — ED Notes (Signed)
Irrigated pts R ear several times, pt states he does not feel as much pressure or pain as he did earlier, PA-C made aware

## 2023-02-20 NOTE — ED Triage Notes (Signed)
"  Right earache since Thursday, progressively getting worse, now whole right side of my face hurts and having a headache too" per pt Denies cold symptoms, denies n/v/d

## 2023-02-20 NOTE — Discharge Instructions (Signed)
You have prescribed debrox to help soften the wax in your ear - this should be easier to flush after using this medicine twice daily for the next week.  You may try flushing at home or plan to see your primary MD if you need assistance with flushing your ear (our urgent care could also help with this).

## 2023-02-21 NOTE — ED Provider Notes (Signed)
[place] EMERGENCY DEPARTMENT AT [facility] Provider Note   CSN: [id number] Arrival date & time: 02/20/23  1007     History  Chief Complaint  Patient presents with   Otalgia    Isaac Lopez is a 62 y.o. male presenting with right ear ache which has progressively worsened over the past 5 days.  He describes pain that radiates into the whole side of his face and behind his ear as well.  He reports reduced hearing acuity on the side.  He states he had similar symptoms in the left ear and was found to have cerumen impaction.  He has actually tried flushing himself at home after applying hydrogen peroxide but was unsuccessful.  The history is provided by the patient.       Home Medications Prior to Admission medications   Medication Sig Start Date End Date Taking? Authorizing Provider  carbamide peroxide (DEBROX) 6.5 % OTIC solution Place 5 drops into the right ear 2 (two) times daily. 02/20/23  Yes Ozzie Remmers, Raynelle Fanning, PA-C  albuterol (VENTOLIN HFA) 108 (90 Base) MCG/ACT inhaler Inhale 1-2 puffs into the lungs every 6 (six) hours as needed for wheezing or shortness of breath.    [provider]  aspirin EC 81 MG tablet Take 81 mg by mouth in the morning. Swallow whole.    [provider]  cetirizine (ZYRTEC) 10 MG tablet Take 10 mg by mouth in the morning.    [provider]  fenofibrate (TRICOR) 145 MG tablet Take 72.5 mg by mouth in the morning.    [provider]  hydrochlorothiazide (HYDRODIURIL) 25 MG tablet Take 25 mg by mouth in the morning.    [provider]  losartan (COZAAR) 50 MG tablet Take 50 mg by mouth in the morning.    [provider]      Allergies    Other    Review of Systems   Review of Systems  Constitutional:  Negative for chills and fever.  HENT:  Positive for ear pain and hearing loss. Negative for congestion, ear discharge, rhinorrhea, sinus pressure, sore throat, trouble swallowing and voice change.    Eyes:  Negative for discharge.  Respiratory:  Negative for cough, shortness of breath, wheezing and stridor.   Genitourinary: Negative.   All other systems reviewed and are negative.   Physical Exam Updated Vital Signs BP 125/84 (BP Location: Right Arm)   Pulse 65   Temp 98.4 F (36.9 C) (Oral)   Resp 18   Ht 5\' 9"  (1.753 m)   Wt 96.6 kg   SpO2 98%   BMI 31.45 kg/m  Physical Exam Constitutional:      Appearance: He is well-developed.  HENT:     Head: Normocephalic and atraumatic.     Right Ear: There is impacted cerumen.     Left Ear: Tympanic membrane and ear canal normal.     Nose: No rhinorrhea.     Mouth/Throat:     Mouth: Mucous membranes are moist.     Pharynx: Oropharynx is clear. Uvula midline. No oropharyngeal exudate or posterior oropharyngeal erythema.     Tonsils: No tonsillar abscesses.  Eyes:     Conjunctiva/sclera: Conjunctivae normal.  Cardiovascular:     Rate and Rhythm: Normal rate.  Pulmonary:     Effort: Pulmonary effort is normal.  Musculoskeletal:        General: Normal range of motion.  Skin:    General: Skin is warm and dry.  Findings: No rash.  Neurological:     Mental Status: He is alert and oriented to person, place, and time.     ED Results / Procedures / Treatments   Labs (all labs ordered are listed, but only abnormal results are displayed) Labs Reviewed - No data to display  EKG None  Radiology No results found.  Procedures .Ear Cerumen Removal  Date/Time: 02/20/2023 12:00 PM  Performed by: Burgess Amor, PA-C Authorized by: Burgess Amor, PA-C   Universal protocol:    Procedure explained and questions answered to patient or proxy's satisfaction: yes     Patient identity confirmed:  Verbally with patient Procedure details:    Location:  R ear   Procedure type: irrigation     Procedure outcomes: unable to remove cerumen   Post-procedure details:    Inspection:  No bleeding and some cerumen remaining   Hearing  quality:  Diminished   Procedure completion:  Tolerated with difficulty Comments:     Small hard portions of cerumen were removed both with irrigation and using a curette.  Initially patient with treated with hydrogen peroxide for about 10 minutes in the canal to help soften the cerumen.  However it remained fairly hard, removed significant cerumen but the remaining was too hard and proximal to the TM, patient was having increased difficulty tolerating, therefore procedure was terminated.  He is prescribed Debrox to treat in his ear for the next week after which is recommended he have repeat attempt at removal, either with home flushing or follow-up with his PCP.       Medications Ordered in ED Medications - No data to display  ED Course/ Medical Decision Making/ A&P                                 Medical Decision Making Patient with a simple right ear canal cerumen impaction, partially removed but significant hard adherent cerumen still remains very deep within the canal.  Continued Debrox treatment for the next week followed by repeat irrigation for hopeful more successful removal.  Risk OTC drugs.           Final Clinical Impression(s) / ED Diagnoses Final diagnoses:  Impacted cerumen of right ear    Rx / DC Orders ED Discharge Orders          Ordered    carbamide peroxide (DEBROX) 6.5 % OTIC solution  2 times daily        02/20/23 1332              Burgess Amor, Cordelia Poche 02/22/23 1913    Bethann Berkshire, MD 02/25/23 1011

## 2023-03-07 ENCOUNTER — Ambulatory Visit: Payer: Medicaid Other | Admitting: Gastroenterology

## 2023-03-07 ENCOUNTER — Encounter: Payer: Self-pay | Admitting: Gastroenterology

## 2023-03-07 VITALS — BP 128/77 | HR 83 | Temp 97.7°F | Ht 69.0 in | Wt 217.6 lb

## 2023-03-07 DIAGNOSIS — K6289 Other specified diseases of anus and rectum: Secondary | ICD-10-CM | POA: Diagnosis not present

## 2023-03-07 NOTE — Progress Notes (Signed)
Gastroenterology Office Note     Primary Care Physician:  Tylene Fantasia., PA-C  Primary Gastroenterologist: Dr. Marletta Lor   Chief Complaint   Chief Complaint  Patient presents with   Follow-up    Follow up after colonoscopy     History of Present Illness   Yuriy Brosey is a 62 y.o. male presenting today with a history of rectal pain due to anal fissure, tubular adenoma in July 2024 with poor prep and surveillance due in 2029. Last seen prior to colonoscopy.    Anal fissure noted on colonoscopy. Scant amount of bleeding. Stool softener once a day. No straining. Max of 5 minutes on toilet. BM every day. Rectal pain resolved. BM usually daily. He would still like to get the cream to have on hand.      Colonoscopy 01/17/23:  Anal fissure found on perianal exam.                           - Non-bleeding internal hemorrhoids.                           - One 8 mm polyp in the cecum, removed with a cold                            snare. Resected and retrieved.                 - Stool in the entire examined colon s/p lavage with fair visualization. 5 year surveillance.   Past Medical History:  Diagnosis Date   Chest pain    Hyperlipemia    Hypertension     Past Surgical History:  Procedure Laterality Date   BUNIONECTOMY Right    COLONOSCOPY WITH PROPOFOL N/A 01/17/2023   Procedure: COLONOSCOPY WITH PROPOFOL;  Surgeon: Lanelle Bal, DO;  [facility];  Service: Endoscopy;  Laterality: N/A;  1:30pm;asa 2   POLYPECTOMY  01/17/2023   Procedure: POLYPECTOMY;  Surgeon: Lanelle Bal, DO;  Location: [facility];  Service: Endoscopy;;    Current Outpatient Medications  Medication Sig Dispense Refill   albuterol (VENTOLIN HFA) 108 (90 Base) MCG/ACT inhaler Inhale 1-2 puffs into the lungs every 6 (six) hours as needed for wheezing or shortness of breath.     aspirin EC 81 MG tablet Take 81 mg by mouth in the morning. Swallow whole.     carbamide peroxide  (DEBROX) 6.5 % OTIC solution Place 5 drops into the right ear 2 (two) times daily. 15 mL 0   cetirizine (ZYRTEC) 10 MG tablet Take 10 mg by mouth in the morning.     fenofibrate (TRICOR) 145 MG tablet Take 72.5 mg by mouth in the morning.     hydrochlorothiazide (HYDRODIURIL) 25 MG tablet Take 25 mg by mouth in the morning.     losartan (COZAAR) 50 MG tablet Take 50 mg by mouth in the morning.     No current facility-administered medications for this visit.    Allergies as of 03/07/2023 - Review Complete 03/07/2023  Allergen Reaction Noted   Other  01/15/2011    Family History  Problem Relation Age of Onset   Diabetes Mother    Cancer Father        throat   Emphysema Sister    Cirrhosis Sister    Thyroid cancer Sister  Arthritis Brother     Social History   Socioeconomic History   Marital status: Single    Spouse name: Not on file   Number of children: Not on file   Years of education: Not on file   Highest education level: Not on file  Occupational History   Not on file  Tobacco Use   Smoking status: Never    Passive exposure: Past   Smokeless tobacco: Never  Vaping Use   Vaping status: Never Used  Substance and Sexual Activity   Alcohol use: No   Drug use: No   Sexual activity: Yes    Birth control/protection: Condom  Other Topics Concern   Not on file  Social History Narrative   Not on file   Social Determinants of Health   Financial Resource Strain: Not on File (07/16/2018)   Received from [employer], [employer]    Financial Resource Strain: 0  Food Insecurity: Not on file (02/26/2023)  Transportation Needs: Not on File (07/16/2018)   Received from [facility], [employer] Needs    Transportation: 0  Physical Activity: Not on File (07/16/2018)   Received from [place], [place]   Physical Activity    Physical Activity: 0  Stress: Not on File (07/16/2018)   Received from [place], [place]   Stress    Stress: 0  Social Connections:  Unknown (11/02/2021)   Received from [facility], [facility]   Social Network    Social Network: Not on file  Intimate Partner Violence: Unknown (09/24/2021)   Received from [facility], [facility]   HITS    Physically Hurt: Not on file    Insult or Talk Down To: Not on file    Threaten Physical Harm: Not on file    Scream or Curse: Not on file     Review of Systems   Gen: Denies any fever, chills, fatigue, weight loss, lack of appetite.  CV: Denies chest pain, heart palpitations, peripheral edema, syncope.  Resp: Denies shortness of breath at rest or with exertion. Denies wheezing or cough.  GI: Denies dysphagia or odynophagia. Denies jaundice, hematemesis, fecal incontinence. GU : Denies urinary burning, urinary frequency, urinary hesitancy MS: Denies joint pain, muscle weakness, cramps, or limitation of movement.  Derm: Denies rash, itching, dry skin Psych: Denies depression, anxiety, memory loss, and confusion Heme: Denies bruising, bleeding, and enlarged lymph nodes.   Physical Exam   BP 128/77   Pulse 83   Temp 97.7 F (36.5 C)   Ht 5\' 9"  (1.753 m)   Wt 217 lb 9.6 oz (98.7 kg)   BMI 32.13 kg/m  General:   Alert and oriented. Pleasant and cooperative. Well-nourished and well-developed.  Head:  Normocephalic and atraumatic. Eyes:  Without icterus Abdomen:  +BS, soft, non-tender and non-distended. No HSM noted. No guarding or rebound. No masses appreciated.  Rectal:  Deferred  Msk:  Symmetrical without gross deformities. Normal posture. Extremities:  Without edema. Neurologic:  Alert and  oriented x4;  grossly normal neurologically. Skin:  Intact without significant lesions or rashes. Psych:  Alert and cooperative. Normal mood and affect.   Assessment   Hrithik Goertz is a 62 y.o. male presenting today with a history of rectal pain due to anal fissure, tubular adenoma in July 2024 with poor prep and surveillance due in 2029.   Rectal pain resolved and notes  scant bleeding at times. He has not had any topical treatment for this despite calling into [facility] when initially seen. I have verified the prescription is still on file. He will use this for the next few weeks.   Continue with bowel regimen, avoidance of straining and constipation. Call if any recurrent. Complete topical treatment.    PLAN    Compounded [facility] cream with diltiazem and lidocaine on file for patient to pick up and complete a course 5 year surveillance colonoscopy Call if recurrent symptoms Continue bowel regimen Return prn   Gelene Mink, PhD, [facility] Gastroenterology

## 2023-03-07 NOTE — Patient Instructions (Signed)
[facility] has the compounded cream prescription on file. They just asked that you either call or go by there to give your updated insurance information so they can run it through!  If you get the cream, you can use it 2-3 times per day for next 2 weeks.   Please let us know if any recurrent of symptoms!  Your next colonoscopy will be in 2029!  I enjoyed seeing you again today! I value our relationship and want to provide genuine, compassionate, and quality care. You may receive a survey regarding your visit with me, and I welcome your feedback! Thanks so much for taking the time to complete this. I look forward to seeing you again.      Gelene Mink, PhD, ANP-BC [facility] Gastroenterology

## 2023-03-31 NOTE — Progress Notes (Deleted)
Office Visit Note  Patient: Isaac Lopez             Date of Birth: [birth year]           MRN: [id number]             PCP: Tylene Fantasia., PA-C Referring: Tylene Fantasia., PA-C Visit Date: 04/14/2023 Occupation: @GUAROCC @  Subjective:  No chief complaint on file.   History of Present Illness: Isaac Lopez is a 62 y.o. male ***     Activities of Daily Living:  Patient reports morning stiffness for *** {minute/hour:19697}.   Patient {ACTIONS;DENIES/REPORTS:21021675::"Denies"} nocturnal pain.  Difficulty dressing/grooming: {ACTIONS;DENIES/REPORTS:21021675::"Denies"} Difficulty climbing stairs: {ACTIONS;DENIES/REPORTS:21021675::"Denies"} Difficulty getting out of chair: {ACTIONS;DENIES/REPORTS:21021675::"Denies"} Difficulty using hands for taps, buttons, cutlery, and/or writing: {ACTIONS;DENIES/REPORTS:21021675::"Denies"}  No Rheumatology ROS completed.   PMFS History:  Patient Active Problem List   Diagnosis Date Noted   Rectal pain 12/21/2022   Disc disease, degenerative, cervical 10/26/2020   Eczematoid otitis externa 10/26/2020   Erectile dysfunction 10/26/2020   Essential hypertension 10/26/2020   Thrombocytopenia (HCC) 10/26/2020   Biliary sludge determined by ultrasound 12/02/2014   Right-sided chest pain 12/02/2014    Past Medical History:  Diagnosis Date   Chest pain    Hyperlipemia    Hypertension     Family History  Problem Relation Age of Onset   Diabetes Mother    Cancer Father        throat   Emphysema Sister    Cirrhosis Sister    Thyroid cancer Sister    Arthritis Brother    Past Surgical History:  Procedure Laterality Date   BUNIONECTOMY Right    COLONOSCOPY WITH PROPOFOL N/A 01/17/2023   Procedure: COLONOSCOPY WITH PROPOFOL;  Surgeon: Lanelle Bal, DO;  [facility];  Service: Endoscopy;  Laterality: N/A;  1:30pm;asa 2   POLYPECTOMY  01/17/2023   Procedure: POLYPECTOMY;  Surgeon: Lanelle Bal, DO;  [facility];  Service: Endoscopy;;   Social History   Social History Narrative   Not on file    There is no immunization history on file for this patient.   Objective: Vital Signs: There were no vitals taken for this visit.   Physical Exam   Musculoskeletal Exam: ***  CDAI Exam: CDAI Score: -- Patient Global: --; Provider Global: -- Swollen: --; Tender: -- Joint Exam 04/14/2023   No joint exam has been documented for this visit   There is currently no information documented on the homunculus. Go to the Rheumatology activity and complete the homunculus joint exam.  Investigation: No additional findings.  Imaging: No results found.  Recent Labs: Lab Results  Component Value Date   WBC 5.5 12/16/2022   HGB 12.3 (L) 12/16/2022   HGB 12.6 (L) 12/16/2022   PLT 183 12/16/2022   NA 136 01/13/2023   K 3.7 01/13/2023   CL 102 01/13/2023   CO2 26 01/13/2023   GLUCOSE 103 (H) 01/13/2023   BUN 20 01/13/2023   CREATININE 1.56 (H) 01/13/2023   BILITOT 0.8 12/16/2022   ALKPHOS 67 12/16/2022   AST 21 12/16/2022   ALT 17 12/16/2022   PROT 7.5 12/16/2022   ALBUMIN 4.0 12/16/2022   CALCIUM 9.2 01/13/2023   GFRAA >60 12/08/2019   QFTBGOLDPLUS Negative 12/16/2022    Speciality Comments: No specialty comments available.  Procedures:  No procedures performed Allergies: Other   Assessment / Plan:     Visit Diagnoses: No diagnosis found.  Orders: No orders of the  defined types were placed in this encounter.  No orders of the defined types were placed in this encounter.   Face-to-face time spent with patient was *** minutes. Greater than 50% of time was spent in counseling and coordination of care.  Follow-Up Instructions: No follow-ups on file.   Ellen Henri, CMA  Note - This record has been created using Animal nutritionist.  Chart creation errors have been sought, but may not always  have been located. Such creation errors do not reflect on  the standard of  medical care.

## 2023-04-14 ENCOUNTER — Ambulatory Visit: Payer: Self-pay | Admitting: Rheumatology

## 2023-04-14 DIAGNOSIS — N1831 Chronic kidney disease, stage 3a: Secondary | ICD-10-CM

## 2023-04-14 DIAGNOSIS — D696 Thrombocytopenia, unspecified: Secondary | ICD-10-CM

## 2023-04-14 DIAGNOSIS — I10 Essential (primary) hypertension: Secondary | ICD-10-CM

## 2023-04-14 DIAGNOSIS — M25461 Effusion, right knee: Secondary | ICD-10-CM

## 2023-04-14 DIAGNOSIS — M1711 Unilateral primary osteoarthritis, right knee: Secondary | ICD-10-CM

## 2023-04-14 DIAGNOSIS — M5030 Other cervical disc degeneration, unspecified cervical region: Secondary | ICD-10-CM

## 2023-04-14 DIAGNOSIS — M25471 Effusion, right ankle: Secondary | ICD-10-CM

## 2023-04-14 DIAGNOSIS — K625 Hemorrhage of anus and rectum: Secondary | ICD-10-CM

## 2023-04-14 DIAGNOSIS — M47816 Spondylosis without myelopathy or radiculopathy, lumbar region: Secondary | ICD-10-CM

## 2023-12-28 ENCOUNTER — Emergency Department (HOSPITAL_COMMUNITY)

## 2023-12-28 ENCOUNTER — Other Ambulatory Visit: Payer: Self-pay

## 2023-12-28 ENCOUNTER — Encounter (HOSPITAL_COMMUNITY): Payer: Self-pay | Admitting: Emergency Medicine

## 2023-12-28 ENCOUNTER — Observation Stay (HOSPITAL_COMMUNITY)
Admission: EM | Admit: 2023-12-28 | Discharge: 2023-12-31 | Disposition: A | Discharge status: Home / Self Care | Attending: Internal Medicine | Admitting: Internal Medicine

## 2023-12-28 DIAGNOSIS — R404 Transient alteration of awareness: Secondary | ICD-10-CM | POA: Diagnosis not present

## 2023-12-28 DIAGNOSIS — R0789 Other chest pain: Secondary | ICD-10-CM | POA: Diagnosis present

## 2023-12-28 DIAGNOSIS — E782 Mixed hyperlipidemia: Secondary | ICD-10-CM | POA: Insufficient documentation

## 2023-12-28 DIAGNOSIS — Z7901 Long term (current) use of anticoagulants: Secondary | ICD-10-CM | POA: Diagnosis not present

## 2023-12-28 DIAGNOSIS — N1831 Chronic kidney disease, stage 3a: Secondary | ICD-10-CM | POA: Diagnosis not present

## 2023-12-28 DIAGNOSIS — R739 Hyperglycemia, unspecified: Secondary | ICD-10-CM | POA: Diagnosis not present

## 2023-12-28 DIAGNOSIS — Z7982 Long term (current) use of aspirin: Secondary | ICD-10-CM | POA: Diagnosis not present

## 2023-12-28 DIAGNOSIS — I82401 Acute embolism and thrombosis of unspecified deep veins of right lower extremity: Secondary | ICD-10-CM | POA: Insufficient documentation

## 2023-12-28 DIAGNOSIS — R410 Disorientation, unspecified: Secondary | ICD-10-CM | POA: Insufficient documentation

## 2023-12-28 DIAGNOSIS — I2699 Other pulmonary embolism without acute cor pulmonale: Secondary | ICD-10-CM | POA: Diagnosis not present

## 2023-12-28 DIAGNOSIS — J9601 Acute respiratory failure with hypoxia: Secondary | ICD-10-CM | POA: Insufficient documentation

## 2023-12-28 DIAGNOSIS — R7302 Impaired glucose tolerance (oral): Secondary | ICD-10-CM | POA: Insufficient documentation

## 2023-12-28 DIAGNOSIS — G459 Transient cerebral ischemic attack, unspecified: Secondary | ICD-10-CM | POA: Insufficient documentation

## 2023-12-28 DIAGNOSIS — F4489 Other dissociative and conversion disorders: Secondary | ICD-10-CM | POA: Diagnosis not present

## 2023-12-28 DIAGNOSIS — I129 Hypertensive chronic kidney disease with stage 1 through stage 4 chronic kidney disease, or unspecified chronic kidney disease: Secondary | ICD-10-CM | POA: Diagnosis not present

## 2023-12-28 DIAGNOSIS — I10 Essential (primary) hypertension: Secondary | ICD-10-CM | POA: Diagnosis present

## 2023-12-28 LAB — CBC
HCT: 41 % (ref 39.0–52.0)
Hemoglobin: 13.4 g/dL (ref 13.0–17.0)
MCH: 26.3 pg (ref 26.0–34.0)
MCHC: 32.7 g/dL (ref 30.0–36.0)
MCV: 80.4 fL (ref 80.0–100.0)
Platelets: 169 K/uL (ref 150–400)
RBC: 5.1 MIL/uL (ref 4.22–5.81)
RDW: 13.6 % (ref 11.5–15.5)
WBC: 6.1 K/uL (ref 4.0–10.5)
nRBC: 0 % (ref 0.0–0.2)

## 2023-12-28 LAB — BASIC METABOLIC PANEL WITH GFR
Anion gap: 13 (ref 5–15)
BUN: 17 mg/dL (ref 8–23)
CO2: 25 mmol/L (ref 22–32)
Calcium: 9.5 mg/dL (ref 8.9–10.3)
Chloride: 100 mmol/L (ref 98–111)
Creatinine, Ser: 1.7 mg/dL — ABNORMAL HIGH (ref 0.61–1.24)
GFR, Estimated: 44 mL/min — ABNORMAL LOW (ref >60)
Glucose, Bld: 142 mg/dL — ABNORMAL HIGH (ref 70–99)
Potassium: 3.7 mmol/L (ref 3.5–5.1)
Sodium: 138 mmol/L (ref 135–145)

## 2023-12-28 LAB — TROPONIN I (HIGH SENSITIVITY): Troponin I (High Sensitivity): 3 ng/L (ref <18)

## 2023-12-28 NOTE — ED Triage Notes (Signed)
 Pt c/o left sided chest pain and left sided flank pain that started a 2-3 hours pta.

## 2023-12-29 ENCOUNTER — Emergency Department (HOSPITAL_COMMUNITY)

## 2023-12-29 ENCOUNTER — Observation Stay (HOSPITAL_BASED_OUTPATIENT_CLINIC_OR_DEPARTMENT_OTHER)

## 2023-12-29 ENCOUNTER — Observation Stay (HOSPITAL_COMMUNITY)

## 2023-12-29 ENCOUNTER — Observation Stay (HOSPITAL_COMMUNITY)
Admit: 2023-12-29 | Discharge: 2023-12-29 | Disposition: A | Discharge status: Home / Self Care | Attending: Internal Medicine

## 2023-12-29 DIAGNOSIS — E782 Mixed hyperlipidemia: Secondary | ICD-10-CM | POA: Insufficient documentation

## 2023-12-29 DIAGNOSIS — R410 Disorientation, unspecified: Secondary | ICD-10-CM | POA: Insufficient documentation

## 2023-12-29 DIAGNOSIS — I10 Essential (primary) hypertension: Secondary | ICD-10-CM

## 2023-12-29 DIAGNOSIS — I2699 Other pulmonary embolism without acute cor pulmonale: Secondary | ICD-10-CM | POA: Diagnosis not present

## 2023-12-29 DIAGNOSIS — N1831 Chronic kidney disease, stage 3a: Secondary | ICD-10-CM | POA: Insufficient documentation

## 2023-12-29 DIAGNOSIS — I2609 Other pulmonary embolism with acute cor pulmonale: Secondary | ICD-10-CM

## 2023-12-29 DIAGNOSIS — F4489 Other dissociative and conversion disorders: Secondary | ICD-10-CM

## 2023-12-29 DIAGNOSIS — R569 Unspecified convulsions: Secondary | ICD-10-CM | POA: Diagnosis not present

## 2023-12-29 DIAGNOSIS — J9601 Acute respiratory failure with hypoxia: Secondary | ICD-10-CM | POA: Insufficient documentation

## 2023-12-29 LAB — URINALYSIS, ROUTINE W REFLEX MICROSCOPIC
Bilirubin Urine: NEGATIVE
Glucose, UA: NEGATIVE mg/dL
Hgb urine dipstick: NEGATIVE
Ketones, ur: NEGATIVE mg/dL
Leukocytes,Ua: NEGATIVE
Nitrite: NEGATIVE
Protein, ur: NEGATIVE mg/dL
Specific Gravity, Urine: 1.018 (ref 1.005–1.030)
pH: 6 (ref 5.0–8.0)

## 2023-12-29 LAB — CBC
HCT: 43.1 % (ref 39.0–52.0)
Hemoglobin: 13 g/dL (ref 13.0–17.0)
MCH: 25.8 pg — ABNORMAL LOW (ref 26.0–34.0)
MCHC: 30.2 g/dL (ref 30.0–36.0)
MCV: 85.5 fL (ref 80.0–100.0)
Platelets: 165 K/uL (ref 150–400)
RBC: 5.04 MIL/uL (ref 4.22–5.81)
RDW: 14.4 % (ref 11.5–15.5)
WBC: 7.3 K/uL (ref 4.0–10.5)
nRBC: 0 % (ref 0.0–0.2)

## 2023-12-29 LAB — ECHOCARDIOGRAM COMPLETE
Area-P 1/2: 3.91 cm2
Height: 69 in
S' Lateral: 2.8 cm
Weight: 3481.5 [oz_av]

## 2023-12-29 LAB — COMPREHENSIVE METABOLIC PANEL WITH GFR
ALT: 13 U/L (ref 0–44)
AST: 15 U/L (ref 15–41)
Albumin: 3.5 g/dL (ref 3.5–5.0)
Alkaline Phosphatase: 48 U/L (ref 38–126)
Anion gap: 9 (ref 5–15)
BUN: 17 mg/dL (ref 8–23)
CO2: 25 mmol/L (ref 22–32)
Calcium: 9.1 mg/dL (ref 8.9–10.3)
Chloride: 101 mmol/L (ref 98–111)
Creatinine, Ser: 1.53 mg/dL — ABNORMAL HIGH (ref 0.61–1.24)
GFR, Estimated: 50 mL/min — ABNORMAL LOW (ref >60)
Glucose, Bld: 99 mg/dL (ref 70–99)
Potassium: 3.9 mmol/L (ref 3.5–5.1)
Sodium: 135 mmol/L (ref 135–145)
Total Bilirubin: 1 mg/dL (ref 0.0–1.2)
Total Protein: 7 g/dL (ref 6.5–8.1)

## 2023-12-29 LAB — HEPATIC FUNCTION PANEL
ALT: 14 U/L (ref 0–44)
AST: 18 U/L (ref 15–41)
Albumin: 3.8 g/dL (ref 3.5–5.0)
Alkaline Phosphatase: 52 U/L (ref 38–126)
Bilirubin, Direct: 0.2 mg/dL (ref 0.0–0.2)
Indirect Bilirubin: 0.6 mg/dL (ref 0.3–0.9)
Total Bilirubin: 0.8 mg/dL (ref 0.0–1.2)
Total Protein: 7.4 g/dL (ref 6.5–8.1)

## 2023-12-29 LAB — TROPONIN I (HIGH SENSITIVITY): Troponin I (High Sensitivity): 3 ng/L (ref <18)

## 2023-12-29 LAB — PROCALCITONIN: Procalcitonin: <0.1 ng/mL

## 2023-12-29 LAB — HEPARIN LEVEL (UNFRACTIONATED)
Heparin Unfractionated: 0.34 [IU]/mL (ref 0.30–0.70)
Heparin Unfractionated: 0.15 [IU]/mL — ABNORMAL LOW (ref 0.30–0.70)

## 2023-12-29 LAB — MAGNESIUM: Magnesium: 2.3 mg/dL (ref 1.7–2.4)

## 2023-12-29 LAB — HIV ANTIBODY (ROUTINE TESTING W REFLEX): HIV Screen 4th Generation wRfx: NONREACTIVE

## 2023-12-29 LAB — LIPASE, BLOOD: Lipase: 33 U/L (ref 11–51)

## 2023-12-29 LAB — PHOSPHORUS: Phosphorus: 3.5 mg/dL (ref 2.5–4.6)

## 2023-12-29 MED ORDER — HYDROCHLOROTHIAZIDE 25 MG PO TABS
25.0000 mg | ORAL_TABLET | Freq: Every day | ORAL | Status: DC
  Administered 2023-12-29 – 2023-12-31 (×3): 25 mg via ORAL
  Filled 2023-12-29 (×3): qty 1

## 2023-12-29 MED ORDER — HEPARIN BOLUS VIA INFUSION
2700.0000 [IU] | Freq: Once | INTRAVENOUS | Status: AC
  Administered 2023-12-29: 2700 [IU] via INTRAVENOUS
  Filled 2023-12-29: qty 2700

## 2023-12-29 MED ORDER — ACETAMINOPHEN 325 MG PO TABS
650.0000 mg | ORAL_TABLET | Freq: Four times a day (QID) | ORAL | Status: DC | PRN
  Administered 2023-12-29: 650 mg via ORAL
  Filled 2023-12-29: qty 2

## 2023-12-29 MED ORDER — HEPARIN BOLUS VIA INFUSION
6000.0000 [IU] | Freq: Once | INTRAVENOUS | Status: AC
  Administered 2023-12-29: 6000 [IU] via INTRAVENOUS

## 2023-12-29 MED ORDER — FENOFIBRATE 54 MG PO TABS
54.0000 mg | ORAL_TABLET | Freq: Every day | ORAL | Status: DC
  Administered 2023-12-29 – 2023-12-31 (×3): 54 mg via ORAL
  Filled 2023-12-29 (×5): qty 1

## 2023-12-29 MED ORDER — STROKE: EARLY STAGES OF RECOVERY BOOK
Freq: Once | Status: AC
Start: 2023-12-30 — End: 2023-12-30
  Filled 2023-12-29: qty 1

## 2023-12-29 MED ORDER — ACETAMINOPHEN 650 MG RE SUPP: 650.0000 mg | Freq: Four times a day (QID) | RECTAL | Status: DC | PRN

## 2023-12-29 MED ORDER — MORPHINE SULFATE (PF) 4 MG/ML IV SOLN
4.0000 mg | Freq: Once | INTRAVENOUS | Status: AC
  Administered 2023-12-29: 4 mg via INTRAVENOUS
  Filled 2023-12-29: qty 1

## 2023-12-29 MED ORDER — ASPIRIN 81 MG PO TBEC
81.0000 mg | DELAYED_RELEASE_TABLET | Freq: Every day | ORAL | Status: DC
  Administered 2023-12-29 – 2023-12-31 (×3): 81 mg via ORAL
  Filled 2023-12-29 (×3): qty 1

## 2023-12-29 MED ORDER — IOHEXOL 350 MG/ML SOLN
75.0000 mL | Freq: Once | INTRAVENOUS | Status: AC | PRN
  Administered 2023-12-29: 75 mL via INTRAVENOUS

## 2023-12-29 MED ORDER — HEPARIN (PORCINE) 25000 UT/250ML-% IV SOLN
1600.0000 [IU]/h | INTRAVENOUS | Status: DC
  Administered 2023-12-29 (×2): 1300 [IU]/h via INTRAVENOUS
  Administered 2023-12-30: 1600 [IU]/h via INTRAVENOUS
  Filled 2023-12-29 (×3): qty 250

## 2023-12-29 MED ORDER — ONDANSETRON HCL 4 MG/2ML IJ SOLN: 4.0000 mg | Freq: Four times a day (QID) | INTRAMUSCULAR | Status: DC | PRN

## 2023-12-29 MED ORDER — ONDANSETRON HCL 4 MG PO TABS: 4.0000 mg | ORAL_TABLET | Freq: Four times a day (QID) | ORAL | Status: DC | PRN

## 2023-12-29 NOTE — Progress Notes (Signed)
 EEG complete, results are pending.

## 2023-12-29 NOTE — Progress Notes (Signed)
 Waiting on room to be cleaned at this time. Report received.

## 2023-12-29 NOTE — Procedures (Signed)
 Patient Name: Bayden Gil  MRN: [id number]  Epilepsy Attending: Arlin MALVA Krebs  Referring Physician/Provider: Evonnie Lenis, MD  Date: 12/29/2023 Duration: 22.46 mins  Patient history: 63yo M with transient confusion. EEG to evaluate for seizure  Level of alertness: Awake  AEDs during EEG study: None  Technical aspects: This EEG study was done with scalp electrodes positioned according to the 10-20 International system of electrode placement. Electrical activity was reviewed with band pass filter of 1-70Hz , sensitivity of 7 uV/mm, display speed of 25mm/sec with a 60Hz  notched filter applied as appropriate. EEG data were recorded continuously and digitally stored.  Video monitoring was available and reviewed as appropriate.  Description: The posterior dominant rhythm consists of 9-10 Hz activity of moderate voltage (25-35 uV) seen predominantly in posterior head regions, symmetric and reactive to eye opening and eye closing. Physiologic photic driving was not seen during photic stimulation. Hyperventilation was not performed.     IMPRESSION: This study is within normal limits. No seizures or epileptiform discharges were seen throughout the recording.  A normal interictal EEG does not exclude the diagnosis of epilepsy.   Casimir Barcellos O Reisa Coppola

## 2023-12-29 NOTE — ED Notes (Signed)
 Pt ambulated to the bathroom with standby assist

## 2023-12-29 NOTE — Progress Notes (Signed)
 Echocardiogram 2D Echocardiogram has been performed.  Koleen KANDICE Popper, RDCS 12/29/2023, 3:19 PM

## 2023-12-29 NOTE — Progress Notes (Signed)
 PHARMACY - ANTICOAGULATION CONSULT NOTE  Pharmacy Consult for Heparin   Indication: pulmonary embolus  Patient Measurements: Height: 5' 9 (175.3 cm) Weight: 96.2 kg (212 lb 1.3 oz) IBW/kg (Calculated) : 70.7 HEPARIN  DW (KG): 90.7  Estimated Creatinine Clearance: 55.8 mL/min (A) (by C-G formula based on SCr of 1.53 mg/dL (H)).  Assessment: 63 y/o M with hx HTN, HLD admitted with chest pain and found to have new onset pulmonary embolus. Starting heparin . PTA meds reviewed. Above labs reviewed. CT head negative.   Heparin  level therapeutic at 0.34. Will recheck to ensure consistent and stable.   Goal of Therapy:  Heparin  level 0.3-0.7 units/ml Monitor platelets by anticoagulation protocol: Yes  0711 @ 1813: HL 0.15 = subtherapeutic   Plan: 0711 @ 1813: HL 0.15 Give 2700 units bolus x1; then increase rate of heparin  infusion to 1600 units/hour. Check heparin  level in 6 hours, then daily once at least two levels are consecutively therapeutic. Continue to monitor CBC daily while on heparin  infusion.  Will M. Lenon, PharmD Clinical Pharmacist 12/29/2023 9:20 PM

## 2023-12-29 NOTE — Progress Notes (Signed)
 Pt arrived to room 320 via WC from ED. Pt ambulatory to bed without assistance. C/O some dyspnea with exertion and pain in left lateral chest with deep breath or coughing. SaO2 93% after O2 off for 10 minutes, O2 discontinued due to stable SaO2. Pt agreeable. Other VSS. Heparin  drip infusing per order, no s/s infiltration noted. Pt oriented to room and safety procedures, states understanding. Bed alarm on for safety due to anticoagulation therapy, pt states understanding. Call bell within reach.

## 2023-12-29 NOTE — Progress Notes (Addendum)
 PT Cancellation Note  Patient Details Name: Isaac Lopez MRN: [id number] DOB: 1960/08/09   Cancelled Treatment:      Reason Eval/Treat Not Completed: Medical issues which prohibited therapy. Medical hold on pt due to PE and pt not yet having 24 hours of heparin  drip. Will attempt evaluation later when pt is medically appropriate.    [name] PT, DPT [facility] [phone] office   [name] 12/29/2023, 10:32 AM

## 2023-12-29 NOTE — Progress Notes (Signed)
 PHARMACY - ANTICOAGULATION CONSULT NOTE  Pharmacy Consult for Heparin   Indication: pulmonary embolus  Patient Measurements: Height: 5' 9 (175.3 cm) Weight: 98.7 kg (217 lb 9.5 oz) IBW/kg (Calculated) : 70.7 HEPARIN  DW (KG): 91.5  Estimated Creatinine Clearance: 56.5 mL/min (A) (by C-G formula based on SCr of 1.53 mg/dL (H)).   Assessment: 63 y/o M with hx HTN, HLD admitted with chest pain and found to have new onset pulmonary embolus. Starting heparin . PTA meds reviewed. Above labs reviewed. CT head negative.   Heparin  level therapeutic at 0.34. Will recheck to ensure consistent and stable.   Goal of Therapy:  Heparin  level 0.3-0.7 units/ml Monitor platelets by anticoagulation protocol: Yes   Plan:  Heparin  level in 8 hours Monitor daily labs, adverse events, f/u oral transition.   Isaac Lopez, PharmD Clinical Pharmacist 12/29/2023 1:32 PM

## 2023-12-29 NOTE — Progress Notes (Signed)
 OT Cancellation Note  Patient Details Name: Isaac Lopez MRN: [id number] DOB: 1961-02-06   Cancelled Treatment:    Reason Eval/Treat Not Completed: Medical issues which prohibited therapy. Medical hold on pt due to PE and pt not yet having 24 hours of heparin  drip. Will attempt evaluation later when pt is medically appropriate.   Tymon Nemetz OT, MOT   Jayson Person 12/29/2023, 10:26 AM

## 2023-12-29 NOTE — ED Notes (Signed)
 MRI delayed per Dr Tat due to patient being on heparin  drip

## 2023-12-29 NOTE — Progress Notes (Signed)
 PHARMACY - ANTICOAGULATION CONSULT NOTE  Pharmacy Consult for Heparin   Indication: pulmonary embolus  Allergies  Allergen Reactions   Other     Dust mites, ragweed, cigarette smoke, cockroach    Patient Measurements: Height: 5' 9 (175.3 cm) Weight: 98.7 kg (217 lb 9.5 oz) IBW/kg (Calculated) : 70.7 HEPARIN  DW (KG): 91.5  Vital Signs: Temp: 99 F (37.2 C) (07/11 0200) Temp Source: Oral (07/11 0200) BP: 109/75 (07/11 0500) Pulse Rate: 79 (07/11 0500)  Labs: Recent Labs    12/28/23 2252 12/29/23 0005  HGB 13.4  --   HCT 41.0  --   PLT 169  --   CREATININE 1.70*  --   TROPONINIHS 3 3    Estimated Creatinine Clearance: 50.9 mL/min (A) (by C-G formula based on SCr of 1.7 mg/dL (H)).   Medical History: Past Medical History:  Diagnosis Date   Chest pain    Hyperlipemia    Hypertension     Assessment: 63 y/o M with chest pain found to have new onset pulmonary embolus. Starting heparin . PTA meds reviewed. Above labs reviewed. CT head negative.   Goal of Therapy:  Heparin  level 0.3-0.7 units/ml Monitor platelets by anticoagulation protocol: Yes   Plan:  Heparin  6000 units BOLUS Start heparin  drip at 1300 units/hr Heparin  level in 8 hours Daily CBC/Heparin  level Monitor for bleeding  Lynwood Mckusick, PharmD, BCPS Clinical Pharmacist Phone: [phone]

## 2023-12-29 NOTE — Progress Notes (Addendum)
 PROGRESS NOTE  Isaac Lopez FMW:[id number] DOB: 1960-07-13 DOA: 12/28/2023 PCP: Catharine Ethelene BIRCH., PA-C  Brief History:  63 year old male with a history of hypertension and hyperlipidemia presenting with left-sided chest pain and left upper abdominal pain that began around 2 PM on 12/28/2023.  He denied any fevers, chills, shortness of breath, cough, hemoptysis.  The patient states that he recently returned from a flight from [employer] [place]  on 12/12/2023. He states that he had a transient confusional episode that started on Friday afternoon 12/22/2023.  He felt like he was having some word finding difficulty.  He also had a mild headache at that time.  He developed some generalized weakness but denied any focal extremity weakness, visual loss, or dysesthesias.  He states that the entire episode lasted about 12 hours.  However he continued to have some dizziness and generalized weakness that persisted until Tuesday, 12/26/2023.  He states that all his symptoms resolved by 12/26/2023. Interestingly, he has been also complaining of some right knee and calf pain that began about the same time. He denies any new medications. In the ED, the patient was afebrile hemodynamically stable with oxygen saturation 88% on room air.  He was placed on 2 L with saturation up to 93%.  BMP showed sodium 135, potassium 2.9, bicarbonate 25, serum creatinine 1.53.  LFTs were unremarkable.  WBC 6.1, hemoglobin 13.4, platelets 169.  Troponin 3>>3.  CTA chest showed bilateral segmental and subsegmental pulmonary emboli in the pulmonary artery with RV/LV ratio of 0.7.  There was atelectasis and infiltrate in the bilateral bases.  CT of the brain was negative for acute findings.  Chest x-ray showed bibasilar atelectasis. The patient was started on IV heparin .    Assessment/Plan: Acute pulmonary embolus/Right lower extremity DVT. - Appears to be provoked by history although patient states that there is a family history of  pulmonary emboli - Lupus anticoagulant - Prothrombin gene mutation - Factor V Leiden - Continue IV heparin  - Echocardiogram - Right lower extremity ultrasound shows peroneal DVT  Acute respiratory failure with hypoxia - Secondary to pulmonary embolus and atelectatic disease - Stable on 2 L - Wean as tolerated  Transient confusional state - Suspect complicated migraine - EEG - MRI brain - CT brain negative for acute findings  Essential hypertension - Holding losartan and HCTZ secondary to soft blood pressure  Hyperlipidemia - Continue fenofibrate   CKD stage IIIa - Baseline creatinine 1.3-1.6  Hyperglycemia -check A1C      Family Communication:   Family at bedside 7/11  Consultants:  none  Code Status:  FULL   DVT Prophylaxis:  IV Heparin     Procedures: As Listed in Progress Note Above  Antibiotics: None   Total time spent 50 minutes.  Greater than 50% spent face to face counseling and coordinating care.   Subjective: Patient denies fevers, chills, headache, dyspnea, nausea, vomiting, diarrhea, abdominal pain, dysuria, hematuria, hematochezia, and melena.  CP is improving but still present   Objective: Vitals:   12/29/23 0730 12/29/23 0759 12/29/23 0800 12/29/23 0822  BP: 103/75  (!) 124/91   Pulse: 72  76   Resp: 12  16   Temp:  98 F (36.7 C)  99.1 F (37.3 C)  TempSrc:  Oral  Oral  SpO2: 93%  92%   Weight:      Height:        Intake/Output Summary (Last 24 hours) at 12/29/2023 1059 Last data filed at 12/29/2023  9340 Gross per 24 hour  Intake 122 ml  Output --  Net 122 ml   Weight change:  Exam:  General:  Pt is alert, follows commands appropriately, not in acute distress HEENT: No icterus, No thrush, No neck mass, [place]/AT Cardiovascular: RRR, S1/S2, no rubs, no gallops Respiratory: bibasilar crackles. No wheeze Abdomen: Soft/+BS, non tender, non distended, no guarding Extremities: No edema, No lymphangitis, No petechiae, No rashes,  no synovitis Neuro:  CN II-XII intact, strength 4/5 in RUE, RLE, strength 4/5 LUE, LLE; sensation intact bilateral; no dysmetria; babinski equivocal    Data Reviewed: I have personally reviewed following labs and imaging studies Basic Metabolic Panel: Recent Labs  Lab 12/28/23 2252 12/29/23 0628  NA 138 135  K 3.7 3.9  CL 100 101  CO2 25 25  GLUCOSE 142* 99  BUN 17 17  CREATININE 1.70* 1.53*  CALCIUM 9.5 9.1  MG  --  2.3  PHOS  --  3.5   Liver Function Tests: Recent Labs  Lab 12/29/23 0005 12/29/23 0628  AST 18 15  ALT 14 13  ALKPHOS 52 48  BILITOT 0.8 1.0  PROT 7.4 7.0  ALBUMIN 3.8 3.5   Recent Labs  Lab 12/29/23 0005  LIPASE 33   No results for input(s): AMMONIA in the last 168 hours. Coagulation Profile: No results for input(s): INR, PROTIME in the last 168 hours. CBC: Recent Labs  Lab 12/28/23 2252  WBC 6.1  HGB 13.4  HCT 41.0  MCV 80.4  PLT 169   Cardiac Enzymes: No results for input(s): CKTOTAL, CKMB, CKMBINDEX, TROPONINI in the last 168 hours. BNP: Invalid input(s): POCBNP CBG: No results for input(s): GLUCAP in the last 168 hours. HbA1C: No results for input(s): HGBA1C in the last 72 hours. Urine analysis:    Component Value Date/Time   COLORURINE YELLOW 12/29/2023 0040   APPEARANCEUR CLEAR 12/29/2023 0040   LABSPEC 1.018 12/29/2023 0040   PHURINE 6.0 12/29/2023 0040   GLUCOSEU NEGATIVE 12/29/2023 0040   HGBUR NEGATIVE 12/29/2023 0040   BILIRUBINUR NEGATIVE 12/29/2023 0040   KETONESUR NEGATIVE 12/29/2023 0040   PROTEINUR NEGATIVE 12/29/2023 0040   UROBILINOGEN 1.0 12/12/2013 1332   NITRITE NEGATIVE 12/29/2023 0040   LEUKOCYTESUR NEGATIVE 12/29/2023 0040   Sepsis Labs: @LABRCNTIP (procalcitonin:4,lacticidven:4) )No results found for this or any previous visit (from the past 240 hours).   Scheduled Meds:  [START ON 12/30/2023]  stroke: early stages of recovery book   Does not apply Once   aspirin  EC  81 mg Oral  Daily   fenofibrate   54 mg Oral Daily   hydrochlorothiazide   25 mg Oral Daily   Continuous Infusions:  heparin  1,300 Units/hr (12/29/23 0659)    Procedures/Studies: US  Carotid Bilateral Result Date: 12/29/2023 CLINICAL DATA:  Mental status change, dysarthria, hypertension and hyperlipidemia. EXAM: BILATERAL CAROTID DUPLEX ULTRASOUND TECHNIQUE: Elnor scale imaging, color Doppler and duplex ultrasound were performed of bilateral carotid and vertebral arteries in the neck. COMPARISON:  None Available. FINDINGS: Criteria: Quantification of carotid stenosis is based on velocity parameters that correlate the residual internal carotid diameter with NASCET-based stenosis levels, using the diameter of the distal internal carotid lumen as the denominator for stenosis measurement. The following velocity measurements were obtained: RIGHT ICA:  74/27 cm/sec CCA:  125/21 cm/sec SYSTOLIC ICA/CCA RATIO:  0.6 ECA:  77 cm/sec LEFT ICA:  69/26 cm/sec CCA:  103/21 cm/sec SYSTOLIC ICA/CCA RATIO:  0.7 ECA:  84 cm/sec RIGHT CAROTID ARTERY: No focal plaque or evidence of carotid stenosis in  the neck. The right ICA is mildly tortuous. RIGHT VERTEBRAL ARTERY: Antegrade flow with normal waveform and velocity. LEFT CAROTID ARTERY: No focal plaque or evidence carotid stenosis in the neck. LEFT VERTEBRAL ARTERY: Antegrade flow with normal waveform and velocity. IMPRESSION: No evidence of carotid stenosis in the neck. Electronically Signed   By: Marcey Moan M.D.   On: 12/29/2023 09:38   US  Venous Img Lower Bilateral (DVT) Result Date: 12/29/2023 CLINICAL DATA:  Right calf pain and edema for 1 week. EXAM: BILATERAL LOWER EXTREMITY VENOUS DOPPLER ULTRASOUND TECHNIQUE: Gray-scale sonography with graded compression, as well as color Doppler and duplex ultrasound were performed to evaluate the lower extremity deep venous systems from the level of the common femoral vein and including the common femoral, femoral, profunda femoral,  popliteal and calf veins including the posterior tibial, peroneal and gastrocnemius veins when visible. The superficial great saphenous vein was also interrogated. Spectral Doppler was utilized to evaluate flow at rest and with distal augmentation maneuvers in the common femoral, femoral and popliteal veins. COMPARISON:  None Available. FINDINGS: RIGHT LOWER EXTREMITY Common Femoral Vein: No evidence of thrombus. Normal compressibility, respiratory phasicity and response to augmentation. Saphenofemoral Junction: No evidence of thrombus. Normal compressibility and flow on color Doppler imaging. Profunda Femoral Vein: No evidence of thrombus. Normal compressibility and flow on color Doppler imaging. Femoral Vein: No evidence of thrombus. Normal compressibility, respiratory phasicity and response to augmentation. Popliteal Vein: No evidence of thrombus. Normal compressibility, respiratory phasicity and response to augmentation. Calf Veins: Isolated DVT in the right peroneal vein. Posterior tibial and anterior tibial veins demonstrate normal patency. Superficial Great Saphenous Vein: No evidence of thrombus. Normal compressibility. Venous Reflux:  None. Other Findings: No evidence of superficial thrombophlebitis or abnormal fluid collection. LEFT LOWER EXTREMITY Common Femoral Vein: No evidence of thrombus. Normal compressibility, respiratory phasicity and response to augmentation. Saphenofemoral Junction: No evidence of thrombus. Normal compressibility and flow on color Doppler imaging. Profunda Femoral Vein: No evidence of thrombus. Normal compressibility and flow on color Doppler imaging. Femoral Vein: No evidence of thrombus. Normal compressibility, respiratory phasicity and response to augmentation. Popliteal Vein: No evidence of thrombus. Normal compressibility, respiratory phasicity and response to augmentation. Calf Veins: No evidence of thrombus. Normal compressibility and flow on color Doppler imaging.  Superficial Great Saphenous Vein: No evidence of thrombus. Normal compressibility. Venous Reflux:  None. Other Findings: No evidence of superficial thrombophlebitis or abnormal fluid collection. IMPRESSION: 1. Isolated DVT in the right peroneal vein. 2. No evidence of left lower extremity DVT. Electronically Signed   By: Marcey Moan M.D.   On: 12/29/2023 09:19   CT HEAD WO CONTRAST ( ) Result Date: 12/29/2023 CLINICAL DATA:  Altered mental status EXAM: CT HEAD WITHOUT CONTRAST TECHNIQUE: Contiguous axial images were obtained from the base of the skull through the vertex without intravenous contrast. RADIATION DOSE REDUCTION: This exam was performed according to the departmental dose-optimization program which includes automated exposure control, adjustment of the mA and/or kV according to patient size and/or use of iterative reconstruction technique. COMPARISON:  None Available. FINDINGS: Brain: No acute intracranial abnormality. Specifically, no hemorrhage, hydrocephalus, mass lesion, acute infarction, or significant intracranial injury. Vascular: No hyperdense vessel or unexpected calcification. Skull: No acute calvarial abnormality. Sinuses/Orbits: No acute findings Other: None IMPRESSION: Normal study. Electronically Signed   By: Franky Crease M.D.   On: 12/29/2023 01:19   CT Angio Chest Pulmonary Embolism (PE) W or WO Contrast Result Date: 12/29/2023 CLINICAL DATA:  Abdominal and flank pain with  stone suspected. Left-sided chest pain and left-sided flank pain starting 3 hours ago. Pulmonary embolus suspected with high probability. EXAM: CT ANGIOGRAPHY CHEST CT ABDOMEN AND PELVIS WITH CONTRAST TECHNIQUE: Multidetector CT imaging of the chest was performed using the standard protocol during bolus administration of intravenous contrast. Multiplanar CT image reconstructions and MIPs were obtained to evaluate the vascular anatomy. Multidetector CT imaging of the abdomen and pelvis was performed using the  standard protocol during bolus administration of intravenous contrast. RADIATION DOSE REDUCTION: This exam was performed according to the departmental dose-optimization program which includes automated exposure control, adjustment of the mA and/or kV according to patient size and/or use of iterative reconstruction technique. CONTRAST:  75mL OMNIPAQUE  IOHEXOL  350 MG/ML SOLN COMPARISON:  CT abdomen pelvis 11/27/2022 FINDINGS: CTA CHEST FINDINGS Cardiovascular: Technically adequate study with good opacification of the central and segmental pulmonary arteries. Moderate motion artifact. Filling defects in multiple bilateral segmental and subsegmental pulmonary arteries in the upper and lower lungs consistent with acute pulmonary embolus. Normal heart size. No pericardial effusions. RA to LV ratio measures 0.7 suggesting no evidence of right heart strain. Normal caliber thoracic aorta. No aortic dissection. Great vessel origins are patent. Calcification in the coronary arteries. Mediastinum/Nodes: No enlarged mediastinal, hilar, or axillary lymph nodes. Thyroid gland, trachea, and esophagus demonstrate no significant findings. Lungs/Pleura: Motion artifact. Atelectasis or infiltration in both lung bases. No pleural effusion or pneumothorax. Musculoskeletal: No chest wall abnormality. No acute or significant osseous findings. Review of the MIP images confirms the above findings. CT ABDOMEN and PELVIS FINDINGS Hepatobiliary: Diffuse fatty infiltration of the liver. No focal lesions. Gallbladder and bile ducts are normal. Pancreas: Unremarkable. No pancreatic ductal dilatation or surrounding inflammatory changes. Spleen: Normal in size without focal abnormality. Adrenals/Urinary Tract: No adrenal gland nodules. Right renal cyst measuring 5.7 cm diameter. No change since prior study. No imaging follow-up is indicated. No hydronephrosis or hydroureter. Bladder wall is thickened. This may be due to under distention or cystitis.  Correlate with urinalysis. Stomach/Bowel: Stomach is within normal limits. Appendix appears normal. No evidence of bowel wall thickening, distention, or inflammatory changes. Vascular/Lymphatic: No significant vascular findings are present. No enlarged abdominal or pelvic lymph nodes. Reproductive: Prostate is unremarkable. Other: No abdominal wall hernia or abnormality. No abdominopelvic ascites. Musculoskeletal: Degenerative changes in the spine. No acute bony abnormalities. Review of the MIP images confirms the above findings. IMPRESSION: 1. Positive examination for multiple bilateral pulmonary emboli. No evidence of right heart strain. 2. Infiltration or atelectasis in both lung bases. 3. Fatty infiltration of the liver. 4. Bladder wall thickening may be due to under distention or cystitis. Correlate with urinalysis. 5. No bowel obstruction or inflammation. Critical Value/emergent results were called by telephone at the time of interpretation on 12/29/2023 at 1:14 am to provider [facility] , who verbally acknowledged these results. Electronically Signed   By: Elsie Gravely M.D.   On: 12/29/2023 01:17   CT ABDOMEN PELVIS W CONTRAST Result Date: 12/29/2023 CLINICAL DATA:  Abdominal and flank pain with stone suspected. Left-sided chest pain and left-sided flank pain starting 3 hours ago. Pulmonary embolus suspected with high probability. EXAM: CT ANGIOGRAPHY CHEST CT ABDOMEN AND PELVIS WITH CONTRAST TECHNIQUE: Multidetector CT imaging of the chest was performed using the standard protocol during bolus administration of intravenous contrast. Multiplanar CT image reconstructions and MIPs were obtained to evaluate the vascular anatomy. Multidetector CT imaging of the abdomen and pelvis was performed using the standard protocol during bolus administration of intravenous contrast. RADIATION  DOSE REDUCTION: This exam was performed according to the departmental dose-optimization program which includes automated  exposure control, adjustment of the mA and/or kV according to patient size and/or use of iterative reconstruction technique. CONTRAST:  75mL OMNIPAQUE  IOHEXOL  350 MG/ML SOLN COMPARISON:  CT abdomen pelvis 11/27/2022 FINDINGS: CTA CHEST FINDINGS Cardiovascular: Technically adequate study with good opacification of the central and segmental pulmonary arteries. Moderate motion artifact. Filling defects in multiple bilateral segmental and subsegmental pulmonary arteries in the upper and lower lungs consistent with acute pulmonary embolus. Normal heart size. No pericardial effusions. RA to LV ratio measures 0.7 suggesting no evidence of right heart strain. Normal caliber thoracic aorta. No aortic dissection. Great vessel origins are patent. Calcification in the coronary arteries. Mediastinum/Nodes: No enlarged mediastinal, hilar, or axillary lymph nodes. Thyroid gland, trachea, and esophagus demonstrate no significant findings. Lungs/Pleura: Motion artifact. Atelectasis or infiltration in both lung bases. No pleural effusion or pneumothorax. Musculoskeletal: No chest wall abnormality. No acute or significant osseous findings. Review of the MIP images confirms the above findings. CT ABDOMEN and PELVIS FINDINGS Hepatobiliary: Diffuse fatty infiltration of the liver. No focal lesions. Gallbladder and bile ducts are normal. Pancreas: Unremarkable. No pancreatic ductal dilatation or surrounding inflammatory changes. Spleen: Normal in size without focal abnormality. Adrenals/Urinary Tract: No adrenal gland nodules. Right renal cyst measuring 5.7 cm diameter. No change since prior study. No imaging follow-up is indicated. No hydronephrosis or hydroureter. Bladder wall is thickened. This may be due to under distention or cystitis. Correlate with urinalysis. Stomach/Bowel: Stomach is within normal limits. Appendix appears normal. No evidence of bowel wall thickening, distention, or inflammatory changes. Vascular/Lymphatic: No  significant vascular findings are present. No enlarged abdominal or pelvic lymph nodes. Reproductive: Prostate is unremarkable. Other: No abdominal wall hernia or abnormality. No abdominopelvic ascites. Musculoskeletal: Degenerative changes in the spine. No acute bony abnormalities. Review of the MIP images confirms the above findings. IMPRESSION: 1. Positive examination for multiple bilateral pulmonary emboli. No evidence of right heart strain. 2. Infiltration or atelectasis in both lung bases. 3. Fatty infiltration of the liver. 4. Bladder wall thickening may be due to under distention or cystitis. Correlate with urinalysis. 5. No bowel obstruction or inflammation. Critical Value/emergent results were called by telephone at the time of interpretation on 12/29/2023 at 1:14 am to provider [facility] , who verbally acknowledged these results. Electronically Signed   By: Elsie Gravely M.D.   On: 12/29/2023 01:17   DG Chest 2 View Result Date: 12/28/2023 CLINICAL DATA:  Chest pain EXAM: CHEST - 2 VIEW COMPARISON:  11/27/2022 FINDINGS: No pleural effusion or pneumothorax. Streaky atelectasis or minimal infiltrates at the bases. Normal cardiac size. No pneumothorax IMPRESSION: Streaky atelectasis or minimal infiltrates at the bases. Electronically Signed   By: Luke Bun M.D.   On: 12/28/2023 22:31    Alm Schneider, DO  [facility]  If 7PM-7AM, please contact night-coverage [email or web address] Password [email or web address] 12/29/2023, 10:59 AM   LOS: 0 days

## 2023-12-29 NOTE — ED Notes (Signed)
 ED Provider at bedside.

## 2023-12-29 NOTE — Hospital Course (Addendum)
 64 year old male with a history of hypertension and hyperlipidemia presenting with left-sided chest pain and left upper abdominal pain that began around 2 PM on 12/28/2023.  He denied any fevers, chills, shortness of breath, cough, hemoptysis.  The patient states that he recently returned from a flight from [facility] [place]  on 12/12/2023. He states that he had a transient confusional episode that started on Friday afternoon 12/22/2023.  He felt like he was having some word finding difficulty.  He also had a mild headache at that time.  He developed some generalized weakness but denied any focal extremity weakness, visual loss, or dysesthesias.  He states that the entire episode lasted about 12 hours.  However he continued to have some dizziness and generalized weakness that persisted until Tuesday, 12/26/2023.  He states that all his symptoms resolved by 12/26/2023. Interestingly, he has been also complaining of some right knee and calf pain that began about the same time. He denies any new medications. In the ED, the patient was afebrile hemodynamically stable with oxygen saturation 88% on room air.  He was placed on 2 L with saturation up to 93%.  BMP showed sodium 135, potassium 2.9, bicarbonate 25, serum creatinine 1.53.  LFTs were unremarkable.  WBC 6.1, hemoglobin 13.4, platelets 169.  Troponin 3>>3.  CTA chest showed bilateral segmental and subsegmental pulmonary emboli in the pulmonary artery with RV/LV ratio of 0.7.  There was atelectasis and infiltrate in the bilateral bases.  CT of the brain was negative for acute findings.  Chest x-ray showed bibasilar atelectasis. Carotid ultrasound was negative for hemodynamically significant stenosis. The patient was started on IV heparin .

## 2023-12-29 NOTE — ED Notes (Signed)
 Dr Tat approved pausing heparin  for [facility]

## 2023-12-29 NOTE — H&P (Signed)
 History and Physical    Patient: Isaac Lopez FMW:[id number] DOB: 17-Mar-1961 DOA: 12/28/2023 DOS: the patient was seen and examined on 12/29/2023 PCP: Catharine Ethelene BIRCH., PA-C  Patient coming from: Home  Chief Complaint:  Chief Complaint  Patient presents with   Chest Pain   HPI: Isaac Lopez is a 63 y.o. male with medical history significant of hypertension, hyperlipidemia who presents to the emergency department due to several days of onset of left-sided chest pain with radiation to left upper quadrant.  Pain was described as being of moderate to severe intensity.  He states that he recently flew back from [place].  Patient endorsed a recent confusional state which started on Friday (12/22/23) whereby he became confused about getting to the freeway in a very familiar and known neighborhood.  He also complained of problems with speech and on Saturday (12/23/2023), he complained of difficulty in being able to use his phone due to confusion.  Patient also complained of weakness in both legs, but the symptoms resolved on Tuesday (12/26/2023).  ED Course:  In the emergency department, BP was 161/100, respiratory rate was 29/min, other vital signs were within normal range.  Workup in the ED showed normal CBC and normal BMP except for blood glucose of 142 and creatinine of 1.70.  Troponin x 2 was flat at 3, urinalysis was normal, hepatic function panel was normal. CT angiography of chest was positive for multiple bilateral pulmonary emboli.  No evidence of right heart strain CT abdomen and pelvis showed no bowel obstruction or inflammation CT head without contrast showed normal study Chest x-ray showed streaky atelectasis or minimal infiltrates at bases IV morphine  4 mg x 1 was given, patient was started on a heparin  drip.  TRH was asked to admit patient for further evaluation and management.  Review of Systems: Review of systems as noted in the HPI. All other systems reviewed and are negative.   Past  Medical History:  Diagnosis Date   Chest pain    Hyperlipemia    Hypertension    Past Surgical History:  Procedure Laterality Date   BUNIONECTOMY Right    COLONOSCOPY WITH PROPOFOL  N/A 01/17/2023   Procedure: COLONOSCOPY WITH PROPOFOL ;  Surgeon: Cindie Carlin POUR, DO;  Location: [facility];  Service: Endoscopy;  Laterality: N/A;  1:30pm;asa 2   POLYPECTOMY  01/17/2023   Procedure: POLYPECTOMY;  Surgeon: Cindie Carlin POUR, DO;  Location: [facility];  Service: Endoscopy;;    Social History:  reports that he has never smoked. He has been exposed to tobacco smoke. He has never used smokeless tobacco. He reports that he does not drink alcohol and does not use drugs.   Allergies  Allergen Reactions   Other     Dust mites, ragweed, cigarette smoke, cockroach    Family History  Problem Relation Age of Onset   Diabetes Mother    Cancer Father        throat   Emphysema Sister    Cirrhosis Sister    Thyroid cancer Sister    Arthritis Brother      Prior to Admission medications   Medication Sig Start Date End Date Taking? Authorizing Provider  albuterol  (VENTOLIN  HFA) 108 (90 Base) MCG/ACT inhaler Inhale 1-2 puffs into the lungs every 6 (six) hours as needed for wheezing or shortness of breath.    [provider]  aspirin  EC 81 MG tablet Take 81 mg by mouth in the morning. Swallow whole.    [provider]  carbamide peroxide (DEBROX) 6.5 % OTIC solution Place 5 drops into the right ear 2 (two) times daily. 02/20/23   Idol, Julie, PA-C  cefdinir (OMNICEF) 300 MG capsule Take 300 mg by mouth 2 (two) times daily.    [provider]  cetirizine (ZYRTEC) 10 MG tablet Take 10 mg by mouth in the morning.    [provider]  fenofibrate  (TRICOR ) 145 MG tablet Take 72.5 mg by mouth in the morning.    [provider]  hydrochlorothiazide  (HYDRODIURIL ) 25 MG tablet Take 25 mg by mouth in the morning.    [provider]  losartan (COZAAR)  50 MG tablet Take 50 mg by mouth in the morning.    [provider]    Physical Exam: BP 109/75   Pulse 79   Temp 99 F (37.2 C) (Oral)   Resp 14   Ht 5' 9 (1.753 m)   Wt 98.7 kg   SpO2 93%   BMI 32.13 kg/m   General: 63 y.o. year-old male well developed well nourished in no acute distress.  Alert and oriented x3. HEENT: NCAT, EOMI Neck: Supple, trachea medial Cardiovascular: Regular rate and rhythm with no rubs or gallops.  No thyromegaly or JVD noted.  No lower extremity edema. 2/4 pulses in all 4 extremities. Respiratory: Clear to auscultation with no wheezes or rales. Good inspiratory effort. Abdomen: Soft, nontender nondistended with normal bowel sounds x4 quadrants. Muskuloskeletal: No cyanosis, clubbing or edema noted bilaterally Neuro: CN II-XII intact, strength 5/5 x 4, sensation, reflexes intact Skin: No ulcerative lesions noted or rashes Psychiatry: Judgement and insight appear normal. Mood is appropriate for condition and setting          Labs on Admission:  Basic Metabolic Panel: Recent Labs  Lab 12/28/23 2252  NA 138  K 3.7  CL 100  CO2 25  GLUCOSE 142*  BUN 17  CREATININE 1.70*  CALCIUM 9.5   Liver Function Tests: Recent Labs  Lab 12/29/23 0005  AST 18  ALT 14  ALKPHOS 52  BILITOT 0.8  PROT 7.4  ALBUMIN 3.8   Recent Labs  Lab 12/29/23 0005  LIPASE 33   No results for input(s): AMMONIA in the last 168 hours. CBC: Recent Labs  Lab 12/28/23 2252  WBC 6.1  HGB 13.4  HCT 41.0  MCV 80.4  PLT 169   Cardiac Enzymes: No results for input(s): CKTOTAL, CKMB, CKMBINDEX, TROPONINI in the last 168 hours.  BNP (last 3 results) No results for input(s): BNP in the last 8760 hours.  ProBNP (last 3 results) No results for input(s): PROBNP in the last 8760 hours.  CBG: No results for input(s): GLUCAP in the last 168 hours.  Radiological Exams on Admission: CT HEAD WO CONTRAST ( ) Result Date: 12/29/2023 CLINICAL  DATA:  Altered mental status EXAM: CT HEAD WITHOUT CONTRAST TECHNIQUE: Contiguous axial images were obtained from the base of the skull through the vertex without intravenous contrast. RADIATION DOSE REDUCTION: This exam was performed according to the departmental dose-optimization program which includes automated exposure control, adjustment of the mA and/or kV according to patient size and/or use of iterative reconstruction technique. COMPARISON:  None Available. FINDINGS: Brain: No acute intracranial abnormality. Specifically, no hemorrhage, hydrocephalus, mass lesion, acute infarction, or significant intracranial injury. Vascular: No hyperdense vessel or unexpected calcification. Skull: No acute calvarial abnormality. Sinuses/Orbits: No acute findings Other: None IMPRESSION: Normal study. Electronically Signed   By: Franky Crease M.D.   On: 12/29/2023 01:19  CT Angio Chest Pulmonary Embolism (PE) W or WO Contrast Result Date: 12/29/2023 CLINICAL DATA:  Abdominal and flank pain with stone suspected. Left-sided chest pain and left-sided flank pain starting 3 hours ago. Pulmonary embolus suspected with high probability. EXAM: CT ANGIOGRAPHY CHEST CT ABDOMEN AND PELVIS WITH CONTRAST TECHNIQUE: Multidetector CT imaging of the chest was performed using the standard protocol during bolus administration of intravenous contrast. Multiplanar CT image reconstructions and MIPs were obtained to evaluate the vascular anatomy. Multidetector CT imaging of the abdomen and pelvis was performed using the standard protocol during bolus administration of intravenous contrast. RADIATION DOSE REDUCTION: This exam was performed according to the departmental dose-optimization program which includes automated exposure control, adjustment of the mA and/or kV according to patient size and/or use of iterative reconstruction technique. CONTRAST:  75mL OMNIPAQUE  IOHEXOL  350 MG/ML SOLN COMPARISON:  CT abdomen pelvis 11/27/2022 FINDINGS: CTA  CHEST FINDINGS Cardiovascular: Technically adequate study with good opacification of the central and segmental pulmonary arteries. Moderate motion artifact. Filling defects in multiple bilateral segmental and subsegmental pulmonary arteries in the upper and lower lungs consistent with acute pulmonary embolus. Normal heart size. No pericardial effusions. RA to LV ratio measures 0.7 suggesting no evidence of right heart strain. Normal caliber thoracic aorta. No aortic dissection. Great vessel origins are patent. Calcification in the coronary arteries. Mediastinum/Nodes: No enlarged mediastinal, hilar, or axillary lymph nodes. Thyroid gland, trachea, and esophagus demonstrate no significant findings. Lungs/Pleura: Motion artifact. Atelectasis or infiltration in both lung bases. No pleural effusion or pneumothorax. Musculoskeletal: No chest wall abnormality. No acute or significant osseous findings. Review of the MIP images confirms the above findings. CT ABDOMEN and PELVIS FINDINGS Hepatobiliary: Diffuse fatty infiltration of the liver. No focal lesions. Gallbladder and bile ducts are normal. Pancreas: Unremarkable. No pancreatic ductal dilatation or surrounding inflammatory changes. Spleen: Normal in size without focal abnormality. Adrenals/Urinary Tract: No adrenal gland nodules. Right renal cyst measuring 5.7 cm diameter. No change since prior study. No imaging follow-up is indicated. No hydronephrosis or hydroureter. Bladder wall is thickened. This may be due to under distention or cystitis. Correlate with urinalysis. Stomach/Bowel: Stomach is within normal limits. Appendix appears normal. No evidence of bowel wall thickening, distention, or inflammatory changes. Vascular/Lymphatic: No significant vascular findings are present. No enlarged abdominal or pelvic lymph nodes. Reproductive: Prostate is unremarkable. Other: No abdominal wall hernia or abnormality. No abdominopelvic ascites. Musculoskeletal: Degenerative  changes in the spine. No acute bony abnormalities. Review of the MIP images confirms the above findings. IMPRESSION: 1. Positive examination for multiple bilateral pulmonary emboli. No evidence of right heart strain. 2. Infiltration or atelectasis in both lung bases. 3. Fatty infiltration of the liver. 4. Bladder wall thickening may be due to under distention or cystitis. Correlate with urinalysis. 5. No bowel obstruction or inflammation. Critical Value/emergent results were called by telephone at the time of interpretation on 12/29/2023 at 1:14 am to provider [facility] , who verbally acknowledged these results. Electronically Signed   By: Elsie Gravely M.D.   On: 12/29/2023 01:17   CT ABDOMEN PELVIS W CONTRAST Result Date: 12/29/2023 CLINICAL DATA:  Abdominal and flank pain with stone suspected. Left-sided chest pain and left-sided flank pain starting 3 hours ago. Pulmonary embolus suspected with high probability. EXAM: CT ANGIOGRAPHY CHEST CT ABDOMEN AND PELVIS WITH CONTRAST TECHNIQUE: Multidetector CT imaging of the chest was performed using the standard protocol during bolus administration of intravenous contrast. Multiplanar CT image reconstructions and MIPs were obtained to evaluate the vascular anatomy.  Multidetector CT imaging of the abdomen and pelvis was performed using the standard protocol during bolus administration of intravenous contrast. RADIATION DOSE REDUCTION: This exam was performed according to the departmental dose-optimization program which includes automated exposure control, adjustment of the mA and/or kV according to patient size and/or use of iterative reconstruction technique. CONTRAST:  75mL OMNIPAQUE  IOHEXOL  350 MG/ML SOLN COMPARISON:  CT abdomen pelvis 11/27/2022 FINDINGS: CTA CHEST FINDINGS Cardiovascular: Technically adequate study with good opacification of the central and segmental pulmonary arteries. Moderate motion artifact. Filling defects in multiple bilateral segmental  and subsegmental pulmonary arteries in the upper and lower lungs consistent with acute pulmonary embolus. Normal heart size. No pericardial effusions. RA to LV ratio measures 0.7 suggesting no evidence of right heart strain. Normal caliber thoracic aorta. No aortic dissection. Great vessel origins are patent. Calcification in the coronary arteries. Mediastinum/Nodes: No enlarged mediastinal, hilar, or axillary lymph nodes. Thyroid gland, trachea, and esophagus demonstrate no significant findings. Lungs/Pleura: Motion artifact. Atelectasis or infiltration in both lung bases. No pleural effusion or pneumothorax. Musculoskeletal: No chest wall abnormality. No acute or significant osseous findings. Review of the MIP images confirms the above findings. CT ABDOMEN and PELVIS FINDINGS Hepatobiliary: Diffuse fatty infiltration of the liver. No focal lesions. Gallbladder and bile ducts are normal. Pancreas: Unremarkable. No pancreatic ductal dilatation or surrounding inflammatory changes. Spleen: Normal in size without focal abnormality. Adrenals/Urinary Tract: No adrenal gland nodules. Right renal cyst measuring 5.7 cm diameter. No change since prior study. No imaging follow-up is indicated. No hydronephrosis or hydroureter. Bladder wall is thickened. This may be due to under distention or cystitis. Correlate with urinalysis. Stomach/Bowel: Stomach is within normal limits. Appendix appears normal. No evidence of bowel wall thickening, distention, or inflammatory changes. Vascular/Lymphatic: No significant vascular findings are present. No enlarged abdominal or pelvic lymph nodes. Reproductive: Prostate is unremarkable. Other: No abdominal wall hernia or abnormality. No abdominopelvic ascites. Musculoskeletal: Degenerative changes in the spine. No acute bony abnormalities. Review of the MIP images confirms the above findings. IMPRESSION: 1. Positive examination for multiple bilateral pulmonary emboli. No evidence of right  heart strain. 2. Infiltration or atelectasis in both lung bases. 3. Fatty infiltration of the liver. 4. Bladder wall thickening may be due to under distention or cystitis. Correlate with urinalysis. 5. No bowel obstruction or inflammation. Critical Value/emergent results were called by telephone at the time of interpretation on 12/29/2023 at 1:14 am to provider [facility] , who verbally acknowledged these results. Electronically Signed   By: Elsie Gravely M.D.   On: 12/29/2023 01:17   DG Chest 2 View Result Date: 12/28/2023 CLINICAL DATA:  Chest pain EXAM: CHEST - 2 VIEW COMPARISON:  11/27/2022 FINDINGS: No pleural effusion or pneumothorax. Streaky atelectasis or minimal infiltrates at the bases. Normal cardiac size. No pneumothorax IMPRESSION: Streaky atelectasis or minimal infiltrates at the bases. Electronically Signed   By: Luke Bun M.D.   On: 12/28/2023 22:31    EKG: I independently viewed the EKG done and my findings are as followed: Sinus tachycardia at a rate of 110 bpm with nonspecific T wave abnormality.  Assessment/Plan Present on Admission:  Acute pulmonary embolism (HCC)  Essential hypertension  Principal Problem:   Acute pulmonary embolism (HCC) Active Problems:   Essential hypertension   Confusion   Mixed hyperlipidemia  Acute pulmonary embolism CT angiography chest with contrast showed multiple bilateral pulmonary emboli.  Patient was started on IV heparin  drip with plan to transition to DOAC in the morning Bilateral lower  extremity ultrasound will be done in the morning Echocardiogram will be done in the morning  Confusion R/O acute ischemic stroke vs TIA Patient will be admitted to telemetry unit  Bilateral carotid ultrasound in the morning Echocardiogram in the morning MRI of brain without contrast in the morning Continue fall precautions and neuro checks Lipid panel and hemoglobin A1c will be checked Continue PT/SLP/OT eval and treat Bedside swallow eval  by nursing prior to diet Consider tele neurology consult status post imaging studies  Essential hypertension-controlled Patient's symptoms was already more than 96 hours Continue HCTZ per home regimen  Mixed hyperlipidemia Continue Tricor  per home regimen   DVT prophylaxis: SCDs  Code Status: Full code  Family Communication: None at bedside  Consults: None  Severity of Illness: The appropriate patient status for this patient is OBSERVATION. Observation status is judged to be reasonable and necessary in order to provide the required intensity of service to ensure the patient's safety. The patient's presenting symptoms, physical exam findings, and initial radiographic and laboratory data in the context of their medical condition is felt to place them at decreased risk for further clinical deterioration. Furthermore, it is anticipated that the patient will be medically stable for discharge from the hospital within 2 midnights of admission.   Author: Girtha Kilgore, DO 12/29/2023 6:23 AM  For on call review [email or web address].

## 2023-12-29 NOTE — TOC CM/SW Note (Signed)
 Transition of Care [facility]) - Inpatient Brief Assessment   Patient Details  Name: Isaac Lopez MRN: [id number] Date of Birth: Nov 21, 1960  [facility] CM/SW Contact:    Lucie Lunger, LCSWA Phone Number: 12/29/2023, 10:57 AM   Clinical Narrative: Transition of Care Department [facility]) has reviewed patient and no TOC needs have been identified at this time. We will continue to monitor patient advancement through interdisciplinary progression rounds. If new patient transition needs arise, please place a TOC consult.     Transition of Care Asessment: Insurance and Status: Insurance coverage has been reviewed Patient has primary care physician: Yes Home environment has been reviewed: From home Prior level of function:: Independent Prior/Current Home Services: No current home services Social Drivers of Health Review: SDOH reviewed no interventions necessary Readmission risk has been reviewed: Yes Transition of care needs: no transition of care needs at this time

## 2023-12-29 NOTE — Consult Note (Signed)
 SLP Cancellation Note  Patient Details Name: Isaac Lopez MRN: [id number] DOB: 10-31-1960   Cancelled treatment:       Reason Eval/Treat Not Completed: SLP screened, no needs identified, will sign off;CT head negative.  Pt noted symptoms have resolved.  No further ST needs identified.     Pat [name],M.S.,CCC-SLP 12/29/2023, 9:59 AM

## 2023-12-29 NOTE — ED Provider Notes (Signed)
 [facility]ERGENC[facility] DEPT [facility] Emergency Department Provider Note MRN:  [id number]  Arrival date & time: 12/29/23     Chief Complaint   Chest Pain   History of Present Illness   Isaac Lopez is a 63 y.o. year-old male with a history of hypertension presenting to the ED with chief complaint of chest pain.  Left-sided chest pain for the past few days, radiates down into the left upper quadrant.  Moderate to severe.  Recent flight back from [place].  Also concerned about a confusion state that occurred several days prior.  Was driving and suddenly became very confused, trouble with speech, trouble using his phone.  New his name and where he was.  Remained mildly confused for couple days and also had lower extremity weakness, trouble walking for a few days.  All of the symptoms resolved.  Review of Systems  A thorough review of systems was obtained and all systems are negative except as noted in the HPI and PMH.   Patient's Health History    Past Medical History:  Diagnosis Date   Chest pain    Hyperlipemia    Hypertension     Past Surgical History:  Procedure Laterality Date   BUNIONECTOMY Right    COLONOSCOPY WITH PROPOFOL  N/A 01/17/2023   Procedure: COLONOSCOPY WITH PROPOFOL ;  Surgeon: Cindie Carlin POUR, DO;  Location: [facility] SU[facility];  Service: Endoscopy;  Laterality: N/A;  1:30pm;asa 2   POLYPECTOMY  01/17/2023   Procedure: POLYPECTOMY;  Surgeon: Cindie Carlin POUR, DO;  Location: [facility] SU[facility];  Service: Endoscopy;;    Family History  Problem Relation Age of Onset   Diabetes Mother    Cancer Father        throat   Emphysema Sister    Cirrhosis Sister    Thyroid cancer Sister    Arthritis Brother     Social History   Socioeconomic History   Marital status: Single    Spouse name: Not on file   Number of children: Not on file   Years of education: Not on file   Highest education level: Not on file  Occupational History   Not on file  Tobacco Use   Smoking  status: Never    Passive exposure: Past   Smokeless tobacco: Never  Vaping Use   Vaping status: Never Used  Substance and Sexual Activity   Alcohol use: No   Drug use: No   Sexual activity: Yes    Birth control/protection: Condom  Other Topics Concern   Not on file  Social History Narrative   Not on file   Social Drivers of Health   Financial Resource Strain: Not on File (07/16/2018)   Received from [employer]    Financial Resource Strain: 0  Food Insecurity: Not on File (03/16/2023)   Received from [facility] Insecurity    Food: 0  Transportation Needs: Not on File (07/16/2018)   Received from [employer] Needs    Transportation: 0  Physical Activity: Not on File (07/16/2018)   Received from [facility]   Physical Activity    Physical Activity: 0  Stress: Not on File (07/16/2018)   Received from [facility]   Stress    Stress: 0  Social Connections: Not on File (03/11/2023)   Received from [facility]   Social Connections    Connectedness: 0  Intimate Partner Violence: Unknown (09/24/2021)   Received from [facility]   HITS    Physically Hurt: Not  on file    Insult or Talk Down To: Not on file    Threaten Physical Harm: Not on file    Scream or Curse: Not on file     Physical Exam   Vitals:   12/29/23 0105 12/29/23 0115  BP:  124/78  Pulse: 89 91  Resp: (!) 26 (!) 23  Temp:    SpO2: 93%     CONSTITUTIONAL: Well-appearing, NAD NEURO/PSYCH:  Alert and oriented x 3, normal and symmetric strength and sensation, normal coordination, normal speech EYES:  eyes equal and reactive ENT/NECK:  no LAD, no JVD CARDIO: Regular rate, well-perfused, normal S1 and S2 PULM:  CTAB no wheezing or rhonchi GI/GU:  non-distended, non-tender MSK/SPINE:  No gross deformities, no edema SKIN:  no rash, atraumatic   *Additional and/or pertinent findings included in MDM below  Diagnostic and Interventional Summary    EKG Interpretation Date/Time:  Thursday  December 28 2023 22:12:02 EDT Ventricular Rate:  110 PR Interval:  156 QRS Duration:  76 QT Interval:  322 QTC Calculation: 435 R Axis:   -11  Text Interpretation: Sinus tachycardia Nonspecific T wave abnormality Abnormal ECG When compared with ECG of 27-Nov-2022 19:10, PREVIOUS ECG IS PRESENT Confirmed by Cleotilde Rogue ([id number]) on 12/28/2023 10:53:35 PM       Labs Reviewed  BASIC METABOLIC PANEL WITH GFR - Abnormal; Notable for the following components:      Result Value   Glucose, Bld 142 (*)    Creatinine, Ser 1.70 (*)    GFR, Estimated 44 (*)    All other components within normal limits  CBC  HEPATIC FUNCTION PANEL  LIPASE, BLOOD  URINALYSIS, ROUTINE W REFLEX MICROSCOPIC  TROPONIN I (HIGH SENSITIVITY)  TROPONIN I (HIGH SENSITIVITY)    CT HEAD WO CONTRAST ( )  Final Result    CT Angio Chest Pulmonary Embolism (PE) W or WO Contrast  Final Result    CT ABDOMEN PELVIS W CONTRAST  Final Result    DG Chest 2 View  Final Result    MR BRAIN WO CONTRAST    (Results Pending)    Medications  heparin  ADULT infusion 100 units/mL (25000 units/250mL) (1,300 Units/hr Intravenous New Bag/Given 12/29/23 0208)  iohexol  (OMNIPAQUE ) 350 MG/ML injection 75 mL (75 mLs Intravenous Contrast Given 12/29/23 0046)  morphine  (PF) 4 MG/ML injection 4 mg (4 mg Intravenous Given 12/29/23 0205)  heparin  bolus via infusion 6,000 Units (6,000 Units Intravenous Bolus from Bag 12/29/23 0209)     Procedures  /  Critical Care .Critical Care  Performed by: Theadore Ozell HERO, MD Authorized by: Theadore Ozell HERO, MD   Critical care provider statement:    Critical care time (minutes):  45   Critical care was necessary to treat or prevent imminent or life-threatening deterioration of the following conditions: Acute pulmonary embolism.   Critical care was time spent personally by me on the following activities:  Development of treatment plan with patient or surrogate, discussions with consultants, evaluation of  patient's response to treatment, examination of patient, ordering and review of laboratory studies, ordering and review of radiographic studies, ordering and performing treatments and interventions, pulse oximetry, re-evaluation of patient's condition and review of old charts   ED Course and Medical Decision Making  Initial Impression and Ddx Regarding the chest pain, differential diagnosis includes ACS, PE, MSK, pneumothorax  Regarding the confusional state differential diagnosis includes TIA, stroke, TGA, electrolyte disturbance  Past medical/surgical history that increases complexity of ED encounter: Hypertension  Interpretation of  Diagnostics I personally reviewed the EKG and my interpretation is as follows: Sinus tachycardia with nonspecific findings  No significant blood count or electrolyte disturbance.  Patient Reassessment and Ultimate Disposition/Management     CT is positive for pulmonary emboli.  Given this and patient's unexplained confusional state will request hospitalist admission.  Patient management required discussion with the following services or consulting groups:  Hospitalist Service  Complexity of Problems Addressed Acute illness or injury that poses threat of life of bodily function  Additional Data Reviewed and Analyzed Further history obtained from: None  Additional Factors Impacting ED Encounter Risk Consideration of hospitalization  Ozell HERO. Theadore, MD [facility] Emergency Medicine [facility] [email or web address]  Final Clinical Impressions(s) / ED Diagnoses     ICD-10-CM   1. Acute pulmonary embolism without acute cor pulmonale, unspecified pulmonary embolism type (HCC)  I26.99     2. Confusion state  F44.89       ED Discharge Orders     None        Discharge Instructions Discussed with and Provided to Patient:   Discharge Instructions   None      Theadore Ozell HERO, MD 12/29/23 [phone]

## 2023-12-30 DIAGNOSIS — E782 Mixed hyperlipidemia: Secondary | ICD-10-CM | POA: Diagnosis not present

## 2023-12-30 DIAGNOSIS — I2699 Other pulmonary embolism without acute cor pulmonale: Secondary | ICD-10-CM | POA: Diagnosis not present

## 2023-12-30 DIAGNOSIS — J9601 Acute respiratory failure with hypoxia: Secondary | ICD-10-CM | POA: Diagnosis not present

## 2023-12-30 LAB — CBC
HCT: 41 % (ref 39.0–52.0)
Hemoglobin: 13 g/dL (ref 13.0–17.0)
MCH: 25.3 pg — ABNORMAL LOW (ref 26.0–34.0)
MCHC: 31.7 g/dL (ref 30.0–36.0)
MCV: 79.9 fL — ABNORMAL LOW (ref 80.0–100.0)
Platelets: 170 K/uL (ref 150–400)
RBC: 5.13 MIL/uL (ref 4.22–5.81)
RDW: 13.3 % (ref 11.5–15.5)
WBC: 7.1 K/uL (ref 4.0–10.5)
nRBC: 0 % (ref 0.0–0.2)

## 2023-12-30 LAB — LIPID PANEL
Cholesterol: 100 mg/dL (ref 0–200)
HDL: 53 mg/dL (ref >40)
LDL Cholesterol: 37 mg/dL (ref 0–99)
Total CHOL/HDL Ratio: 1.9 ratio
Triglycerides: 49 mg/dL (ref <150)
VLDL: 10 mg/dL (ref 0–40)

## 2023-12-30 LAB — BLOOD GAS, VENOUS
Acid-Base Excess: 3.4 mmol/L — ABNORMAL HIGH (ref 0.0–2.0)
Bicarbonate: 26.8 mmol/L (ref 20.0–28.0)
Drawn by: 1517
O2 Saturation: 63.9 %
Patient temperature: 36.8
pCO2, Ven: 36 mmHg — ABNORMAL LOW (ref 44–60)
pH, Ven: 7.48 — ABNORMAL HIGH (ref 7.25–7.43)
pO2, Ven: 35 mmHg (ref 32–45)

## 2023-12-30 LAB — BASIC METABOLIC PANEL WITH GFR
Anion gap: 9 (ref 5–15)
BUN: 17 mg/dL (ref 8–23)
CO2: 24 mmol/L (ref 22–32)
Calcium: 9 mg/dL (ref 8.9–10.3)
Chloride: 102 mmol/L (ref 98–111)
Creatinine, Ser: 1.66 mg/dL — ABNORMAL HIGH (ref 0.61–1.24)
GFR, Estimated: 46 mL/min — ABNORMAL LOW (ref >60)
Glucose, Bld: 112 mg/dL — ABNORMAL HIGH (ref 70–99)
Potassium: 3.9 mmol/L (ref 3.5–5.1)
Sodium: 135 mmol/L (ref 135–145)

## 2023-12-30 LAB — MAGNESIUM: Magnesium: 2.1 mg/dL (ref 1.7–2.4)

## 2023-12-30 LAB — HEMOGLOBIN A1C
Hgb A1c MFr Bld: 5.8 % — ABNORMAL HIGH (ref 4.8–5.6)
Mean Plasma Glucose: 120 mg/dL

## 2023-12-30 LAB — PHOSPHORUS: Phosphorus: 2.6 mg/dL (ref 2.5–4.6)

## 2023-12-30 LAB — HEPARIN LEVEL (UNFRACTIONATED): Heparin Unfractionated: 0.4 [IU]/mL (ref 0.30–0.70)

## 2023-12-30 MED ORDER — POLYETHYLENE GLYCOL 3350 17 G PO PACK
17.0000 g | PACK | Freq: Every day | ORAL | Status: DC
  Administered 2023-12-30 – 2023-12-31 (×2): 17 g via ORAL
  Filled 2023-12-30 (×2): qty 1

## 2023-12-30 MED ORDER — APIXABAN 5 MG PO TABS
10.0000 mg | ORAL_TABLET | Freq: Two times a day (BID) | ORAL | Status: DC
  Administered 2023-12-30 – 2023-12-31 (×3): 10 mg via ORAL
  Filled 2023-12-30 (×3): qty 2

## 2023-12-30 MED ORDER — APIXABAN 5 MG PO TABS: 5.0000 mg | ORAL_TABLET | Freq: Two times a day (BID) | ORAL | Status: DC

## 2023-12-30 MED ORDER — SENNA 8.6 MG PO TABS
2.0000 | ORAL_TABLET | Freq: Every day | ORAL | Status: DC
  Administered 2023-12-30 – 2023-12-31 (×2): 17.2 mg via ORAL
  Filled 2023-12-30 (×2): qty 2

## 2023-12-30 NOTE — Progress Notes (Signed)
 PROGRESS NOTE  Isaac Lopez FMW:[id number] DOB: [birth year] DOA: 12/28/2023 PCP: Catharine Ethelene BIRCH., PA-C  Brief History:  63 year old male with a history of hypertension and hyperlipidemia presenting with left-sided chest pain and left upper abdominal pain that began around 2 PM on 12/28/2023.  He denied any fevers, chills, shortness of breath, cough, hemoptysis.  The patient states that he recently returned from a flight from [facility] [place]  on 12/12/2023. He states that he had a transient confusional episode that started on Friday afternoon 12/22/2023.  He felt like he was having some word finding difficulty.  He also had a mild headache at that time.  He developed some generalized weakness but denied any focal extremity weakness, visual loss, or dysesthesias.  He states that the entire episode lasted about 12 hours.  However he continued to have some dizziness and generalized weakness that persisted until Tuesday, 12/26/2023.  He states that all his symptoms resolved by 12/26/2023. Interestingly, he has been also complaining of some right knee and calf pain that began about the same time. He denies any new medications. In the ED, the patient was afebrile hemodynamically stable with oxygen saturation 88% on room air.  He was placed on 2 L with saturation up to 93%.  BMP showed sodium 135, potassium 2.9, bicarbonate 25, serum creatinine 1.53.  LFTs were unremarkable.  WBC 6.1, hemoglobin 13.4, platelets 169.  Troponin 3>>3.  CTA chest showed bilateral segmental and subsegmental pulmonary emboli in the pulmonary artery with RV/LV ratio of 0.7.  There was atelectasis and infiltrate in the bilateral bases.  CT of the brain was negative for acute findings.  Chest x-ray showed bibasilar atelectasis. Carotid ultrasound was negative for hemodynamically significant stenosis. The patient was started on IV heparin .    Assessment/Plan: Acute pulmonary embolus/Right lower extremity DVT. - Appears to be  provoked by history although patient states that there is a family history of pulmonary emboli - Lupus anticoagulant - Prothrombin gene mutation - Factor V Leiden - Continue IV heparin >>transition to apixaban  - Echocardiogram - Right lower extremity ultrasound shows peroneal DVT   Acute respiratory failure with hypoxia - Secondary to pulmonary embolus and atelectatic disease - Stable on 2 L - Weaned to RA   Transient confusional state - Suspect complicated migraine - EEG--neg for seizure - MRI brain--neg for acute findins - CT brain negative for acute findings - mental status back to baseline   Essential hypertension - Holding losartan and HCTZ secondary to soft blood pressure   Hyperlipidemia - Continue fenofibrate    CKD stage IIIa - Baseline creatinine 1.3-1.6   Hyperglycemia/Impaired glucose tolerance -check A1C--5.8           Family Communication:   Family at bedside 7/12   Consultants:  none   Code Status:  FULL    DVT Prophylaxis:  IV Heparin       Procedures: As Listed in Progress Note Above   Antibiotics: None      Subjective: Still has minor LLL, LUQ discomfort--improving.  Denies f/c, cp, sob, n/v/d  Objective: Vitals:   12/29/23 1657 12/29/23 2129 12/30/23 0549 12/30/23 1329  BP: 123/77 126/78 121/80 128/84  Pulse: 91 85 90 91  Resp: 20 17 18    Temp: 99.3 F (37.4 C) 98.2 F (36.8 C) 98.6 F (37 C) 98.2 F (36.8 C)  TempSrc: Oral  Oral Oral  SpO2: 92% 93% 93% 92%  Weight:      Height:  Intake/Output Summary (Last 24 hours) at 12/30/2023 1743 Last data filed at 12/30/2023 1330 Gross per 24 hour  Intake 909.53 ml  Output --  Net 909.53 ml   Weight change: -2.5 kg Exam:  General:  Pt is alert, follows commands appropriately, not in acute distress HEENT: No icterus, No thrush, No neck mass, Ila/AT Cardiovascular: RRR, S1/S2, no rubs, no gallops Respiratory: bibasilar crackles. No wheeze Abdomen: Soft/+BS, non tender,  non distended, no guarding Extremities: No edema, No lymphangitis, No petechiae, No rashes, no synovitis   Data Reviewed: I have personally reviewed following labs and imaging studies Basic Metabolic Panel: Recent Labs  Lab 12/28/23 2252 12/29/23 0628 12/30/23 0351  NA 138 135 135  K 3.7 3.9 3.9  CL 100 101 102  CO2 25 25 24   GLUCOSE 142* 99 112*  BUN 17 17 17   CREATININE 1.70* 1.53* 1.66*  CALCIUM 9.5 9.1 9.0  MG  --  2.3 2.1  PHOS  --  3.5 2.6   Liver Function Tests: Recent Labs  Lab 12/29/23 0005 12/29/23 0628  AST 18 15  ALT 14 13  ALKPHOS 52 48  BILITOT 0.8 1.0  PROT 7.4 7.0  ALBUMIN 3.8 3.5   Recent Labs  Lab 12/29/23 0005  LIPASE 33   No results for input(s): AMMONIA in the last 168 hours. Coagulation Profile: No results for input(s): INR, PROTIME in the last 168 hours. CBC: Recent Labs  Lab 12/28/23 2252 12/29/23 0628 12/30/23 0351  WBC 6.1 7.3 7.1  HGB 13.4 13.0 13.0  HCT 41.0 43.1 41.0  MCV 80.4 85.5 79.9*  PLT 169 165 170   Cardiac Enzymes: No results for input(s): CKTOTAL, CKMB, CKMBINDEX, TROPONINI in the last 168 hours. BNP: Invalid input(s): POCBNP CBG: No results for input(s): GLUCAP in the last 168 hours. HbA1C: Recent Labs    12/29/23 1340  HGBA1C 5.8*   Urine analysis:    Component Value Date/Time   COLORURINE YELLOW 12/29/2023 0040   APPEARANCEUR CLEAR 12/29/2023 0040   LABSPEC 1.018 12/29/2023 0040   PHURINE 6.0 12/29/2023 0040   GLUCOSEU NEGATIVE 12/29/2023 0040   HGBUR NEGATIVE 12/29/2023 0040   BILIRUBINUR NEGATIVE 12/29/2023 0040   KETONESUR NEGATIVE 12/29/2023 0040   PROTEINUR NEGATIVE 12/29/2023 0040   UROBILINOGEN 1.0 12/12/2013 1332   NITRITE NEGATIVE 12/29/2023 0040   LEUKOCYTESUR NEGATIVE 12/29/2023 0040   Sepsis Labs: @LABRCNTIP (procalcitonin:4,lacticidven:4) )No results found for this or any previous visit (from the past 240 hours).   Scheduled Meds:  apixaban   10 mg Oral BID    Followed by   NOREEN ON 01/06/2024] apixaban   5 mg Oral BID   aspirin  EC  81 mg Oral Daily   fenofibrate   54 mg Oral Daily   hydrochlorothiazide   25 mg Oral Daily   polyethylene glycol  17 g Oral Daily   senna  2 tablet Oral Daily   Continuous Infusions:  Procedures/Studies: MR BRAIN WO CONTRAST Result Date: 12/29/2023 EXAM: MRI BRAIN WITHOUT CONTRAST 12/29/2023 04:17:37 PM TECHNIQUE: Multiplanar multisequence MRI of the head/brain was performed without the administration of intravenous contrast. COMPARISON: 12/29/2023 CLINICAL HISTORY: Mental status change, unknown cause. 63 y.o. male with medical history significant of hypertension, hyperlipidemia who presents to the emergency department due to several days of onset of left-sided chest pain with radiation to left upper quadrant. Pain was described as being of moderate to severe intensity. He states that he recently flew back from [place]. FINDINGS: BRAIN AND VENTRICLES: No acute infarct. No intracranial hemorrhage. No mass.  No midline shift. No hydrocephalus. The sella is unremarkable. Normal flow voids. ORBITS: No acute abnormality. SINUSES AND MASTOIDS: No acute abnormality. BONES AND SOFT TISSUES: Normal marrow signal. No acute soft tissue abnormality. IMPRESSION: 1. Normal brain MRI. Electronically signed by: Ryan Chess MD 12/29/2023 04:25 PM EDT RP Workstation: [id number]   ECHOCARDIOGRAM COMPLETE Result Date: 12/29/2023    ECHOCARDIOGRAM REPORT   Patient Name:   TANYON ALIPIO Date of Exam: 12/29/2023 Medical Rec #:  [id number]   Height:       69.0 in Accession #:    [id number]  Weight:       217.6 lb Date of Birth:  [birth year]  BSA:          2.141 m Patient Age:    62 years    BP:           128/83 mmHg Patient Gender: M           HR:           89 bpm. Exam Location:  [facility] Procedure: 2D Echo, Cardiac Doppler, Color Doppler, 3D Echo and Strain Analysis            (Both Spectral and Color Flow Doppler were utilized during             procedure). Indications:    Pulmonary Embolus I26.09  History:        Patient has no prior history of Echocardiogram examinations.                 Chronic Kidney Disease; Risk Factors:Dyslipidemia and                 Hypertension.  Sonographer:    Koleen Popper RDCS Referring Phys: [id number] OLADAPO ADEFESO  Sonographer Comments: Global longitudinal strain was attempted. IMPRESSIONS  1. Left ventricular ejection fraction, by estimation, is 65 to 70%. Left ventricular ejection fraction by 3D volume is 65 %. The left ventricle has normal function. The left ventricle has no regional wall motion abnormalities. Left ventricular diastolic  parameters are indeterminate. The average left ventricular global longitudinal strain is -21.6 %. The global longitudinal strain is normal.  2. Right ventricular systolic function is normal. The right ventricular size is normal. Tricuspid regurgitation signal is inadequate for assessing PA pressure.  3. The mitral valve is grossly normal. No evidence of mitral valve regurgitation.  4. The aortic valve is tricuspid. There is mild calcification of the aortic valve. Aortic valve regurgitation is not visualized. Aortic valve sclerosis is present, with no evidence of aortic valve stenosis.  5. The inferior vena cava is normal in size with greater than 50% respiratory variability, suggesting right atrial pressure of 3 mmHg. Comparison(s): No prior Echocardiogram. FINDINGS  Left Ventricle: Left ventricular ejection fraction, by estimation, is 65 to 70%. Left ventricular ejection fraction by 3D volume is 65 %. The left ventricle has normal function. The left ventricle has no regional wall motion abnormalities. The average left ventricular global longitudinal strain is -21.6 %. Strain was performed and the global longitudinal strain is normal. The left ventricular internal cavity size was normal in size. There is borderline left ventricular hypertrophy. Left ventricular diastolic parameters are  indeterminate. Right Ventricle: The right ventricular size is normal. No increase in right ventricular wall thickness. Right ventricular systolic function is normal. Tricuspid regurgitation signal is inadequate for assessing PA pressure. Left Atrium: Left atrial size was normal in size. Right Atrium: Right atrial size was normal in size. Pericardium: There is  no evidence of pericardial effusion. Mitral Valve: The mitral valve is grossly normal. No evidence of mitral valve regurgitation. Tricuspid Valve: The tricuspid valve is grossly normal. Tricuspid valve regurgitation is trivial. Aortic Valve: The aortic valve is tricuspid. There is mild calcification of the aortic valve. Aortic valve regurgitation is not visualized. Aortic valve sclerosis is present, with no evidence of aortic valve stenosis. Pulmonic Valve: The pulmonic valve was not well visualized. Pulmonic valve regurgitation is trivial. Aorta: The aortic root and ascending aorta are structurally normal, with no evidence of dilitation. Venous: The inferior vena cava is normal in size with greater than 50% respiratory variability, suggesting right atrial pressure of 3 mmHg. IAS/Shunts: No atrial level shunt detected by color flow Doppler. Additional Comments: 3D was performed not requiring image post processing on an independent workstation and was normal.  LEFT VENTRICLE PLAX 2D LVIDd:         4.80 cm         Diastology LVIDs:         2.80 cm         LV e' medial:    7.18 cm/s LV PW:         1.00 cm         LV E/e' medial:  10.0 LV IVS:        1.10 cm         LV e' lateral:   7.51 cm/s LVOT diam:     2.10 cm         LV E/e' lateral: 9.6 LV SV:         82 LV SV Index:   38              2D Longitudinal LVOT Area:     3.46 cm        Strain                                2D Strain GLS   -21.6 %                                Avg:                                 3D Volume EF                                LV 3D EF:    Left                                              ventricul                                             ar                                             ejection  fraction                                             by 3D                                             volume is                                             65 %.                                 3D Volume EF:                                3D EF:        65 %                                LV EDV:       104 ml                                LV ESV:       37 ml                                LV SV:        67 ml RIGHT VENTRICLE          IVC RV Basal diam:  3.60 cm  IVC diam: 2.20 cm LEFT ATRIUM             Index        RIGHT ATRIUM           Index LA diam:        4.00 cm 1.87 cm/m   RA Area:     14.70 cm LA Vol (A2C):   26.8 ml 12.52 ml/m  RA Volume:   34.80 ml  16.25 ml/m LA Vol (A4C):   28.1 ml 13.12 ml/m LA Biplane Vol: 29.2 ml 13.64 ml/m  AORTIC VALVE LVOT Vmax:   144.00 cm/s LVOT Vmean:  88.600 cm/s LVOT VTI:    0.236 m  AORTA Ao Root diam: 3.60 cm Ao Asc diam:  3.40 cm MITRAL VALVE MV Area (PHT): 3.91 cm     SHUNTS MV Decel Time: 194 msec     Systemic VTI:  0.24 m MV E velocity: 72.00 cm/s   Systemic Diam: 2.10 cm MV A velocity: 114.00 cm/s MV E/A ratio:  0.63 Jayson Sierras MD Electronically signed by Jayson Sierras MD Signature Date/Time: 12/29/2023/3:38:32 PM    Final    EEG adult Result Date: 12/29/2023 Shelton Arlin KIDD, MD     12/29/2023  2:33 PM Patient Name: Steffen Hase MRN: [id number] Epilepsy Attending: Arlin KIDD Shelton Referring Physician/Provider: Evonnie Lenis, MD Date: 12/29/2023 Duration: 22.46 mins Patient history: 63yo M with  transient confusion. EEG to evaluate for seizure Level of alertness: Awake AEDs during EEG study: None Technical aspects: This EEG study was done with scalp electrodes positioned according to the 10-20 International system of electrode placement. Electrical activity was reviewed with band pass filter of 1-70Hz ,  sensitivity of 7 uV/mm, display speed of 109mm/sec with a 60Hz  notched filter applied as appropriate. EEG data were recorded continuously and digitally stored.  Video monitoring was available and reviewed as appropriate. Description: The posterior dominant rhythm consists of 9-10 Hz activity of moderate voltage (25-35 uV) seen predominantly in posterior head regions, symmetric and reactive to eye opening and eye closing. Physiologic photic driving was not seen during photic stimulation. Hyperventilation was not performed.   IMPRESSION: This study is within normal limits. No seizures or epileptiform discharges were seen throughout the recording. A normal interictal EEG does not exclude the diagnosis of epilepsy. Priyanka MALVA Krebs   US  Carotid Bilateral Result Date: 12/29/2023 CLINICAL DATA:  Mental status change, dysarthria, hypertension and hyperlipidemia. EXAM: BILATERAL CAROTID DUPLEX ULTRASOUND TECHNIQUE: Elnor scale imaging, color Doppler and duplex ultrasound were performed of bilateral carotid and vertebral arteries in the neck. COMPARISON:  None Available. FINDINGS: Criteria: Quantification of carotid stenosis is based on velocity parameters that correlate the residual internal carotid diameter with NASCET-based stenosis levels, using the diameter of the distal internal carotid lumen as the denominator for stenosis measurement. The following velocity measurements were obtained: RIGHT ICA:  74/27 cm/sec CCA:  125/21 cm/sec SYSTOLIC ICA/CCA RATIO:  0.6 ECA:  77 cm/sec LEFT ICA:  69/26 cm/sec CCA:  103/21 cm/sec SYSTOLIC ICA/CCA RATIO:  0.7 ECA:  84 cm/sec RIGHT CAROTID ARTERY: No focal plaque or evidence of carotid stenosis in the neck. The right ICA is mildly tortuous. RIGHT VERTEBRAL ARTERY: Antegrade flow with normal waveform and velocity. LEFT CAROTID ARTERY: No focal plaque or evidence carotid stenosis in the neck. LEFT VERTEBRAL ARTERY: Antegrade flow with normal waveform and velocity. IMPRESSION: No  evidence of carotid stenosis in the neck. Electronically Signed   By: Marcey Moan M.D.   On: 12/29/2023 09:38   US  Venous Img Lower Bilateral (DVT) Result Date: 12/29/2023 CLINICAL DATA:  Right calf pain and edema for 1 week. EXAM: BILATERAL LOWER EXTREMITY VENOUS DOPPLER ULTRASOUND TECHNIQUE: Gray-scale sonography with graded compression, as well as color Doppler and duplex ultrasound were performed to evaluate the lower extremity deep venous systems from the level of the common femoral vein and including the common femoral, femoral, profunda femoral, popliteal and calf veins including the posterior tibial, peroneal and gastrocnemius veins when visible. The superficial great saphenous vein was also interrogated. Spectral Doppler was utilized to evaluate flow at rest and with distal augmentation maneuvers in the common femoral, femoral and popliteal veins. COMPARISON:  None Available. FINDINGS: RIGHT LOWER EXTREMITY Common Femoral Vein: No evidence of thrombus. Normal compressibility, respiratory phasicity and response to augmentation. Saphenofemoral Junction: No evidence of thrombus. Normal compressibility and flow on color Doppler imaging. Profunda Femoral Vein: No evidence of thrombus. Normal compressibility and flow on color Doppler imaging. Femoral Vein: No evidence of thrombus. Normal compressibility, respiratory phasicity and response to augmentation. Popliteal Vein: No evidence of thrombus. Normal compressibility, respiratory phasicity and response to augmentation. Calf Veins: Isolated DVT in the right peroneal vein. Posterior tibial and anterior tibial veins demonstrate normal patency. Superficial Great Saphenous Vein: No evidence of thrombus. Normal compressibility. Venous Reflux:  None. Other Findings: No evidence of superficial thrombophlebitis or abnormal fluid collection. LEFT LOWER EXTREMITY Common Femoral  Vein: No evidence of thrombus. Normal compressibility, respiratory phasicity and response  to augmentation. Saphenofemoral Junction: No evidence of thrombus. Normal compressibility and flow on color Doppler imaging. Profunda Femoral Vein: No evidence of thrombus. Normal compressibility and flow on color Doppler imaging. Femoral Vein: No evidence of thrombus. Normal compressibility, respiratory phasicity and response to augmentation. Popliteal Vein: No evidence of thrombus. Normal compressibility, respiratory phasicity and response to augmentation. Calf Veins: No evidence of thrombus. Normal compressibility and flow on color Doppler imaging. Superficial Great Saphenous Vein: No evidence of thrombus. Normal compressibility. Venous Reflux:  None. Other Findings: No evidence of superficial thrombophlebitis or abnormal fluid collection. IMPRESSION: 1. Isolated DVT in the right peroneal vein. 2. No evidence of left lower extremity DVT. Electronically Signed   By: Marcey Moan M.D.   On: 12/29/2023 09:19   CT HEAD WO CONTRAST ( ) Result Date: 12/29/2023 CLINICAL DATA:  Altered mental status EXAM: CT HEAD WITHOUT CONTRAST TECHNIQUE: Contiguous axial images were obtained from the base of the skull through the vertex without intravenous contrast. RADIATION DOSE REDUCTION: This exam was performed according to the departmental dose-optimization program which includes automated exposure control, adjustment of the mA and/or kV according to patient size and/or use of iterative reconstruction technique. COMPARISON:  None Available. FINDINGS: Brain: No acute intracranial abnormality. Specifically, no hemorrhage, hydrocephalus, mass lesion, acute infarction, or significant intracranial injury. Vascular: No hyperdense vessel or unexpected calcification. Skull: No acute calvarial abnormality. Sinuses/Orbits: No acute findings Other: None IMPRESSION: Normal study. Electronically Signed   By: Franky Crease M.D.   On: 12/29/2023 01:19   CT Angio Chest Pulmonary Embolism (PE) W or WO Contrast Result Date:  12/29/2023 CLINICAL DATA:  Abdominal and flank pain with stone suspected. Left-sided chest pain and left-sided flank pain starting 3 hours ago. Pulmonary embolus suspected with high probability. EXAM: CT ANGIOGRAPHY CHEST CT ABDOMEN AND PELVIS WITH CONTRAST TECHNIQUE: Multidetector CT imaging of the chest was performed using the standard protocol during bolus administration of intravenous contrast. Multiplanar CT image reconstructions and MIPs were obtained to evaluate the vascular anatomy. Multidetector CT imaging of the abdomen and pelvis was performed using the standard protocol during bolus administration of intravenous contrast. RADIATION DOSE REDUCTION: This exam was performed according to the departmental dose-optimization program which includes automated exposure control, adjustment of the mA and/or kV according to patient size and/or use of iterative reconstruction technique. CONTRAST:  75mL OMNIPAQUE  IOHEXOL  350 MG/ML SOLN COMPARISON:  CT abdomen pelvis 11/27/2022 FINDINGS: CTA CHEST FINDINGS Cardiovascular: Technically adequate study with good opacification of the central and segmental pulmonary arteries. Moderate motion artifact. Filling defects in multiple bilateral segmental and subsegmental pulmonary arteries in the upper and lower lungs consistent with acute pulmonary embolus. Normal heart size. No pericardial effusions. RA to LV ratio measures 0.7 suggesting no evidence of right heart strain. Normal caliber thoracic aorta. No aortic dissection. Great vessel origins are patent. Calcification in the coronary arteries. Mediastinum/Nodes: No enlarged mediastinal, hilar, or axillary lymph nodes. Thyroid gland, trachea, and esophagus demonstrate no significant findings. Lungs/Pleura: Motion artifact. Atelectasis or infiltration in both lung bases. No pleural effusion or pneumothorax. Musculoskeletal: No chest wall abnormality. No acute or significant osseous findings. Review of the MIP images confirms the  above findings. CT ABDOMEN and PELVIS FINDINGS Hepatobiliary: Diffuse fatty infiltration of the liver. No focal lesions. Gallbladder and bile ducts are normal. Pancreas: Unremarkable. No pancreatic ductal dilatation or surrounding inflammatory changes. Spleen: Normal in size without focal abnormality. Adrenals/Urinary Tract: No adrenal gland nodules.  Right renal cyst measuring 5.7 cm diameter. No change since prior study. No imaging follow-up is indicated. No hydronephrosis or hydroureter. Bladder wall is thickened. This may be due to under distention or cystitis. Correlate with urinalysis. Stomach/Bowel: Stomach is within normal limits. Appendix appears normal. No evidence of bowel wall thickening, distention, or inflammatory changes. Vascular/Lymphatic: No significant vascular findings are present. No enlarged abdominal or pelvic lymph nodes. Reproductive: Prostate is unremarkable. Other: No abdominal wall hernia or abnormality. No abdominopelvic ascites. Musculoskeletal: Degenerative changes in the spine. No acute bony abnormalities. Review of the MIP images confirms the above findings. IMPRESSION: 1. Positive examination for multiple bilateral pulmonary emboli. No evidence of right heart strain. 2. Infiltration or atelectasis in both lung bases. 3. Fatty infiltration of the liver. 4. Bladder wall thickening may be due to under distention or cystitis. Correlate with urinalysis. 5. No bowel obstruction or inflammation. Critical Value/emergent results were called by telephone at the time of interpretation on 12/29/2023 at 1:14 am to provider [facility] , who verbally acknowledged these results. Electronically Signed   By: Elsie Gravely M.D.   On: 12/29/2023 01:17   CT ABDOMEN PELVIS W CONTRAST Result Date: 12/29/2023 CLINICAL DATA:  Abdominal and flank pain with stone suspected. Left-sided chest pain and left-sided flank pain starting 3 hours ago. Pulmonary embolus suspected with high probability. EXAM: CT  ANGIOGRAPHY CHEST CT ABDOMEN AND PELVIS WITH CONTRAST TECHNIQUE: Multidetector CT imaging of the chest was performed using the standard protocol during bolus administration of intravenous contrast. Multiplanar CT image reconstructions and MIPs were obtained to evaluate the vascular anatomy. Multidetector CT imaging of the abdomen and pelvis was performed using the standard protocol during bolus administration of intravenous contrast. RADIATION DOSE REDUCTION: This exam was performed according to the departmental dose-optimization program which includes automated exposure control, adjustment of the mA and/or kV according to patient size and/or use of iterative reconstruction technique. CONTRAST:  75mL OMNIPAQUE  IOHEXOL  350 MG/ML SOLN COMPARISON:  CT abdomen pelvis 11/27/2022 FINDINGS: CTA CHEST FINDINGS Cardiovascular: Technically adequate study with good opacification of the central and segmental pulmonary arteries. Moderate motion artifact. Filling defects in multiple bilateral segmental and subsegmental pulmonary arteries in the upper and lower lungs consistent with acute pulmonary embolus. Normal heart size. No pericardial effusions. RA to LV ratio measures 0.7 suggesting no evidence of right heart strain. Normal caliber thoracic aorta. No aortic dissection. Great vessel origins are patent. Calcification in the coronary arteries. Mediastinum/Nodes: No enlarged mediastinal, hilar, or axillary lymph nodes. Thyroid gland, trachea, and esophagus demonstrate no significant findings. Lungs/Pleura: Motion artifact. Atelectasis or infiltration in both lung bases. No pleural effusion or pneumothorax. Musculoskeletal: No chest wall abnormality. No acute or significant osseous findings. Review of the MIP images confirms the above findings. CT ABDOMEN and PELVIS FINDINGS Hepatobiliary: Diffuse fatty infiltration of the liver. No focal lesions. Gallbladder and bile ducts are normal. Pancreas: Unremarkable. No pancreatic  ductal dilatation or surrounding inflammatory changes. Spleen: Normal in size without focal abnormality. Adrenals/Urinary Tract: No adrenal gland nodules. Right renal cyst measuring 5.7 cm diameter. No change since prior study. No imaging follow-up is indicated. No hydronephrosis or hydroureter. Bladder wall is thickened. This may be due to under distention or cystitis. Correlate with urinalysis. Stomach/Bowel: Stomach is within normal limits. Appendix appears normal. No evidence of bowel wall thickening, distention, or inflammatory changes. Vascular/Lymphatic: No significant vascular findings are present. No enlarged abdominal or pelvic lymph nodes. Reproductive: Prostate is unremarkable. Other: No abdominal wall hernia or abnormality. No  abdominopelvic ascites. Musculoskeletal: Degenerative changes in the spine. No acute bony abnormalities. Review of the MIP images confirms the above findings. IMPRESSION: 1. Positive examination for multiple bilateral pulmonary emboli. No evidence of right heart strain. 2. Infiltration or atelectasis in both lung bases. 3. Fatty infiltration of the liver. 4. Bladder wall thickening may be due to under distention or cystitis. Correlate with urinalysis. 5. No bowel obstruction or inflammation. Critical Value/emergent results were called by telephone at the time of interpretation on 12/29/2023 at 1:14 am to provider [facility] , who verbally acknowledged these results. Electronically Signed   By: Elsie Gravely M.D.   On: 12/29/2023 01:17   DG Chest 2 View Result Date: 12/28/2023 CLINICAL DATA:  Chest pain EXAM: CHEST - 2 VIEW COMPARISON:  11/27/2022 FINDINGS: No pleural effusion or pneumothorax. Streaky atelectasis or minimal infiltrates at the bases. Normal cardiac size. No pneumothorax IMPRESSION: Streaky atelectasis or minimal infiltrates at the bases. Electronically Signed   By: Luke Bun M.D.   On: 12/28/2023 22:31    Alm Schneider, DO  [facility]  If  7PM-7AM, please contact night-coverage [email or web address] Password [facility] 12/30/2023, 5:43 PM   LOS: 0 days

## 2023-12-30 NOTE — Plan of Care (Signed)
   Problem: Coping: Goal: Will verbalize positive feelings about self Outcome: Progressing

## 2023-12-30 NOTE — Plan of Care (Signed)
  Problem: Coping: Goal: Will verbalize positive feelings about self 12/30/2023 0401 by Almarie Alberteen SQUIBB, RN Outcome: Progressing 12/30/2023 0400 by Almarie Alberteen SQUIBB, RN Outcome: Progressing

## 2023-12-30 NOTE — Progress Notes (Signed)
 PHARMACY - ANTICOAGULATION CONSULT NOTE  Pharmacy Consult for Heparin   Indication: pulmonary embolus  Patient Measurements: Height: 5' 9 (175.3 cm) Weight: 96.2 kg (212 lb 1.3 oz) IBW/kg (Calculated) : 70.7 HEPARIN  DW (KG): 90.7  Estimated Creatinine Clearance: 51.4 mL/min (A) (by C-G formula based on SCr of 1.66 mg/dL (H)).  Assessment: 63 y/o M with hx HTN, HLD admitted with chest pain and found to have new onset pulmonary embolus. Starting heparin . PTA meds reviewed. Above labs reviewed. CT head negative.   Heparin  level therapeutic at 0.34. Will recheck to ensure consistent and stable.   7/12 AM update: Heparin  level therapeutic   Goal of Therapy:  Heparin  level 0.3-0.7 units/ml Monitor platelets by anticoagulation protocol: Yes   Plan: Cont heparin  1600 units/hr Heparin  level in 6-8 hours  Lynwood Mckusick, PharmD, BCPS Clinical Pharmacist Phone: [phone]

## 2023-12-30 NOTE — Evaluation (Addendum)
 Physical Therapy Brief Evaluation and Discharge Note Patient Details Name: Isaac Lopez MRN: [id number] DOB: 1960/10/24 Today's Date: 12/30/2023   History of Present Illness  63 year old male with a history of hypertension and hyperlipidemia presenting with left-sided chest pain and left upper abdominal pain that began around 2 PM on 12/28/2023.  He denied any fevers, chills, shortness of breath, cough, hemoptysis.  The patient states that he recently returned from a flight from [facility] [place]  on 12/12/2023.  He states that he had a transient confusional episode that started on Friday afternoon 12/22/2023.  He felt like he was having some word finding difficulty.  He also had a mild headache at that time.  He developed some generalized weakness but denied any focal extremity weakness, visual loss, or dysesthesias.  He states that the entire episode lasted about 12 hours.  However he continued to have some dizziness and generalized weakness that persisted until Tuesday, 12/26/2023.  He states that all his symptoms resolved by 12/26/2023.  Interestingly, he has been also complaining of some right knee and calf pain that began about the same time.  He denies any new medications.  Clinical Impression  Evaluation completed after 24 hours on Heparin  due to acute PE. Patient is performing at functional baseline, no safety concerns noted throughout evaluation.   PT to sign off at this time. PT discharging pt to mobility technician and nursing staff for further mobility activities. Please reconsult acute PT services if functional changes occur.  Thank you.       PT Assessment    Assistance Needed at Discharge       Equipment Recommendations None recommended by PT  Recommendations for Other Services       Precautions/Restrictions Precautions Precautions: None Restrictions Weight Bearing Restrictions Per Provider Order: No        Mobility  Bed Mobility          Transfers Overall transfer  level: Independent                      Ambulation/Gait Ambulation/Gait assistance: Independent Gait Distance (Feet): 75 Feet Assistive device: IV Pole Gait Pattern/deviations: WFL(Within Functional Limits)   General Gait Details: no deficits  Home Activity Instructions    Stairs            Modified Rankin (Stroke Patients Only)        Balance                          Pertinent Vitals/Pain   Pain Assessment Pain Assessment: 0-10 Pain Score: 6  Pain Location: left flank Pain Descriptors / Indicators: Aching Pain Intervention(s): Monitored during session     Home Living   Living Arrangements: Alone       Home Equipment: None        Prior Function        UE/LE Assessment               Communication         Cognition         General Comments      Exercises     Assessment/Plan    PT Problem List         PT Visit Diagnosis      No Skilled PT Patient is independent with all acitivity/mobility   Co-evaluation                AMPAC 6 Clicks Help needed turning  from your back to your side while in a flat bed without using bedrails?: None Help needed moving from lying on your back to sitting on the side of a flat bed without using bedrails?: None Help needed moving to and from a bed to a chair (including a wheelchair)?: None Help needed standing up from a chair using your arms (e.g., wheelchair or bedside chair)?: None Help needed to walk in hospital room?: None Help needed climbing 3-5 steps with a railing? : None 6 Click Score: 24      End of Session Equipment Utilized During Treatment: Gait belt   Patient left: in bed Nurse Communication: Mobility status       Time: 9095-9088 PT Time Calculation (min) (ACUTE ONLY): 7 min  Charges:          [name] PT, DPT [facility] [place] [address] [phone] office  [name]  12/30/2023, 10:53 AM

## 2023-12-31 DIAGNOSIS — I2699 Other pulmonary embolism without acute cor pulmonale: Secondary | ICD-10-CM | POA: Diagnosis not present

## 2023-12-31 DIAGNOSIS — N1831 Chronic kidney disease, stage 3a: Secondary | ICD-10-CM | POA: Diagnosis not present

## 2023-12-31 DIAGNOSIS — J9601 Acute respiratory failure with hypoxia: Secondary | ICD-10-CM | POA: Diagnosis not present

## 2023-12-31 LAB — CBC
HCT: 39.1 % (ref 39.0–52.0)
Hemoglobin: 12.7 g/dL — ABNORMAL LOW (ref 13.0–17.0)
MCH: 25.9 pg — ABNORMAL LOW (ref 26.0–34.0)
MCHC: 32.5 g/dL (ref 30.0–36.0)
MCV: 79.8 fL — ABNORMAL LOW (ref 80.0–100.0)
Platelets: 183 K/uL (ref 150–400)
RBC: 4.9 MIL/uL (ref 4.22–5.81)
RDW: 13.5 % (ref 11.5–15.5)
WBC: 5.2 K/uL (ref 4.0–10.5)
nRBC: 0 % (ref 0.0–0.2)

## 2023-12-31 MED ORDER — APIXABAN 5 MG PO TABS: ORAL_TABLET | ORAL | 2 refills | Status: AC

## 2023-12-31 NOTE — Discharge Summary (Signed)
 Physician Discharge Summary   Patient: Isaac Lopez MRN: [id number] DOB: 1961/03/14  Admit date:     12/28/2023  Discharge date: 12/31/23  Discharge Physician: Alm Hasson Gaspard   PCP: Catharine Dunning D., PA-C   Recommendations at discharge:   Please follow up with primary care provider within 1-2 weeks  Please repeat BMP and CBC in one week    Hospital Course: 63 year old male with a history of hypertension and hyperlipidemia presenting with left-sided chest pain and left upper abdominal pain that began around 2 PM on 12/28/2023.  He denied any fevers, chills, shortness of breath, cough, hemoptysis.  The patient states that he recently returned from a flight from [facility]  on 12/12/2023. He states that he had a transient confusional episode that started on Friday afternoon 12/22/2023.  He felt like he was having some word finding difficulty.  He also had a mild headache at that time.  He developed some generalized weakness but denied any focal extremity weakness, visual loss, or dysesthesias.  He states that the entire episode lasted about 12 hours.  However he continued to have some dizziness and generalized weakness that persisted until Tuesday, 12/26/2023.  He states that all his symptoms resolved by 12/26/2023. Interestingly, he has been also complaining of some right knee and calf pain that began about the same time. He denies any new medications. In the ED, the patient was afebrile hemodynamically stable with oxygen saturation 88% on room air.  He was placed on 2 L with saturation up to 93%.  BMP showed sodium 135, potassium 2.9, bicarbonate 25, serum creatinine 1.53.  LFTs were unremarkable.  WBC 6.1, hemoglobin 13.4, platelets 169.  Troponin 3>>3.  CTA chest showed bilateral segmental and subsegmental pulmonary emboli in the pulmonary artery with RV/LV ratio of 0.7.  There was atelectasis and infiltrate in the bilateral bases.  CT of the brain was negative for acute findings.  Chest x-ray showed  bibasilar atelectasis. Carotid ultrasound was negative for hemodynamically significant stenosis. The patient was started on IV heparin .   Assessment and Plan: Acute pulmonary embolus/Right lower extremity DVT. - Appears to be provoked by history although patient states that there is a family history of pulmonary emboli - Lupus anticoagulant-pending at time of d/c - Prothrombin gene mutation--pending at time of dc - Factor V Leiden--pending at time of dc - Continue IV heparin >>transitioned to apixaban  and tolerated well - Echo--EF 65-70%, no WMA, normal RVF - Right lower extremity ultrasound shows peroneal DVT   Acute respiratory failure with hypoxia - Secondary to pulmonary embolus and atelectatic disease - Stable on 2 L - Weaned to RA   Transient confusional state - Suspect complicated migraine - EEG--neg for seizure - MRI brain--neg for acute findins - CT brain negative for acute findings - mental status back to baseline   Essential hypertension - Holding losartan and HCTZ secondary to soft blood pressure>>restart after dc   Hyperlipidemia - Continue fenofibrate    CKD stage IIIa - Baseline creatinine 1.3-1.6   Hyperglycemia/Impaired glucose tolerance -check A1C--5.8       Consultants: none Procedures performed: none  Disposition: Home Diet recommendation:  Cardiac and Carb modified diet DISCHARGE MEDICATION: Allergies as of 12/31/2023       Reactions   Other    Dust mites, ragweed, cigarette smoke, cockroach        Medication List     STOP taking these medications    losartan 50 MG tablet Commonly known as: COZAAR  TAKE these medications    albuterol  108 (90 Base) MCG/ACT inhaler Commonly known as: VENTOLIN  HFA Inhale 1-2 puffs into the lungs every 6 (six) hours as needed for wheezing or shortness of breath.   apixaban  5 MG Tabs tablet Commonly known as: ELIQUIS  Take 2 tablets (10 mg total) by mouth 2 (two) times daily for 6 days,  THEN 1 tablet (5 mg total) 2 (two) times daily. Start taking on: December 31, 2023   aspirin  EC 81 MG tablet Take 81 mg by mouth in the morning. Swallow whole.   cetirizine 10 MG tablet Commonly known as: ZYRTEC Take 10 mg by mouth in the morning.   fenofibrate  145 MG tablet Commonly known as: TRICOR  Take 72.5 mg by mouth in the morning.   hydrochlorothiazide  25 MG tablet Commonly known as: HYDRODIURIL  Take 25 mg by mouth in the morning.   sildenafil 50 MG tablet Commonly known as: VIAGRA Take 50 mg by mouth daily.        Discharge Exam: Filed Weights   12/28/23 2209 12/29/23 1315  Weight: 98.7 kg 96.2 kg   HEENT:  [place]/AT, No thrush, no icterus CV:  RRR, no rub, no S3, no S4 Lung:  fine bibasilar rales. No wheeze Abd:  soft/+BS, NT Ext:  No edema, no lymphangitis, no synovitis, no rash   Condition at discharge: stable  The results of significant diagnostics from this hospitalization (including imaging, microbiology, ancillary and laboratory) are listed below for reference.   Imaging Studies: MR BRAIN WO CONTRAST Result Date: 12/29/2023 EXAM: MRI BRAIN WITHOUT CONTRAST 12/29/2023 04:17:37 PM TECHNIQUE: Multiplanar multisequence MRI of the head/brain was performed without the administration of intravenous contrast. COMPARISON: 12/29/2023 CLINICAL HISTORY: Mental status change, unknown cause. 63 y.o. male with medical history significant of hypertension, hyperlipidemia who presents to the emergency department due to several days of onset of left-sided chest pain with radiation to left upper quadrant. Pain was described as being of moderate to severe intensity. He states that he recently flew back from [place]. FINDINGS: BRAIN AND VENTRICLES: No acute infarct. No intracranial hemorrhage. No mass. No midline shift. No hydrocephalus. The sella is unremarkable. Normal flow voids. ORBITS: No acute abnormality. SINUSES AND MASTOIDS: No acute abnormality. BONES AND SOFT TISSUES: Normal  marrow signal. No acute soft tissue abnormality. IMPRESSION: 1. Normal brain MRI. Electronically signed by: Ryan Chess MD 12/29/2023 04:25 PM EDT RP Workstation: [id number]   ECHOCARDIOGRAM COMPLETE Result Date: 12/29/2023    ECHOCARDIOGRAM REPORT   Patient Name:   QUINDARIUS CABELLO Date of Exam: 12/29/2023 Medical Rec #:  [id number]   Height:       69.0 in Accession #:    [id number]  Weight:       217.6 lb Date of Birth:  1960/08/11  BSA:          2.141 m Patient Age:    62 years    BP:           128/83 mmHg Patient Gender: M           HR:           89 bpm. Exam Location:  [facility] Procedure: 2D Echo, Cardiac Doppler, Color Doppler, 3D Echo and Strain Analysis            (Both Spectral and Color Flow Doppler were utilized during            procedure). Indications:    Pulmonary Embolus I26.09  History:  Patient has no prior history of Echocardiogram examinations.                 Chronic Kidney Disease; Risk Factors:Dyslipidemia and                 Hypertension.  Sonographer:    Koleen Popper RDCS Referring Phys: [id number] OLADAPO ADEFESO  Sonographer Comments: Global longitudinal strain was attempted. IMPRESSIONS  1. Left ventricular ejection fraction, by estimation, is 65 to 70%. Left ventricular ejection fraction by 3D volume is 65 %. The left ventricle has normal function. The left ventricle has no regional wall motion abnormalities. Left ventricular diastolic  parameters are indeterminate. The average left ventricular global longitudinal strain is -21.6 %. The global longitudinal strain is normal.  2. Right ventricular systolic function is normal. The right ventricular size is normal. Tricuspid regurgitation signal is inadequate for assessing PA pressure.  3. The mitral valve is grossly normal. No evidence of mitral valve regurgitation.  4. The aortic valve is tricuspid. There is mild calcification of the aortic valve. Aortic valve regurgitation is not visualized. Aortic valve sclerosis is present, with  no evidence of aortic valve stenosis.  5. The inferior vena cava is normal in size with greater than 50% respiratory variability, suggesting right atrial pressure of 3 mmHg. Comparison(s): No prior Echocardiogram. FINDINGS  Left Ventricle: Left ventricular ejection fraction, by estimation, is 65 to 70%. Left ventricular ejection fraction by 3D volume is 65 %. The left ventricle has normal function. The left ventricle has no regional wall motion abnormalities. The average left ventricular global longitudinal strain is -21.6 %. Strain was performed and the global longitudinal strain is normal. The left ventricular internal cavity size was normal in size. There is borderline left ventricular hypertrophy. Left ventricular diastolic parameters are indeterminate. Right Ventricle: The right ventricular size is normal. No increase in right ventricular wall thickness. Right ventricular systolic function is normal. Tricuspid regurgitation signal is inadequate for assessing PA pressure. Left Atrium: Left atrial size was normal in size. Right Atrium: Right atrial size was normal in size. Pericardium: There is no evidence of pericardial effusion. Mitral Valve: The mitral valve is grossly normal. No evidence of mitral valve regurgitation. Tricuspid Valve: The tricuspid valve is grossly normal. Tricuspid valve regurgitation is trivial. Aortic Valve: The aortic valve is tricuspid. There is mild calcification of the aortic valve. Aortic valve regurgitation is not visualized. Aortic valve sclerosis is present, with no evidence of aortic valve stenosis. Pulmonic Valve: The pulmonic valve was not well visualized. Pulmonic valve regurgitation is trivial. Aorta: The aortic root and ascending aorta are structurally normal, with no evidence of dilitation. Venous: The inferior vena cava is normal in size with greater than 50% respiratory variability, suggesting right atrial pressure of 3 mmHg. IAS/Shunts: No atrial level shunt detected by  color flow Doppler. Additional Comments: 3D was performed not requiring image post processing on an independent workstation and was normal.  LEFT VENTRICLE PLAX 2D LVIDd:         4.80 cm         Diastology LVIDs:         2.80 cm         LV e' medial:    7.18 cm/s LV PW:         1.00 cm         LV E/e' medial:  10.0 LV IVS:        1.10 cm         LV  e' lateral:   7.51 cm/s LVOT diam:     2.10 cm         LV E/e' lateral: 9.6 LV SV:         82 LV SV Index:   38              2D Longitudinal LVOT Area:     3.46 cm        Strain                                2D Strain GLS   -21.6 %                                Avg:                                 3D Volume EF                                LV 3D EF:    Left                                             ventricul                                             ar                                             ejection                                             fraction                                             by 3D                                             volume is                                             65 %.                                 3D Volume EF:  3D EF:        65 %                                LV EDV:       104 ml                                LV ESV:       37 ml                                LV SV:        67 ml RIGHT VENTRICLE          IVC RV Basal diam:  3.60 cm  IVC diam: 2.20 cm LEFT ATRIUM             Index        RIGHT ATRIUM           Index LA diam:        4.00 cm 1.87 cm/m   RA Area:     14.70 cm LA Vol (A2C):   26.8 ml 12.52 ml/m  RA Volume:   34.80 ml  16.25 ml/m LA Vol (A4C):   28.1 ml 13.12 ml/m LA Biplane Vol: 29.2 ml 13.64 ml/m  AORTIC VALVE LVOT Vmax:   144.00 cm/s LVOT Vmean:  88.600 cm/s LVOT VTI:    0.236 m  AORTA Ao Root diam: 3.60 cm Ao Asc diam:  3.40 cm MITRAL VALVE MV Area (PHT): 3.91 cm     SHUNTS MV Decel Time: 194 msec     Systemic VTI:  0.24 m MV E velocity: 72.00 cm/s   Systemic Diam: 2.10  cm MV A velocity: 114.00 cm/s MV E/A ratio:  0.63 Jayson Sierras MD Electronically signed by Jayson Sierras MD Signature Date/Time: 12/29/2023/3:38:32 PM    Final    EEG adult Result Date: 12/29/2023 Shelton Arlin KIDD, MD     12/29/2023  2:33 PM Patient Name: Bronson Bressman MRN: [id number] Epilepsy Attending: Arlin KIDD Shelton Referring Physician/Provider: Evonnie Lenis, MD Date: 12/29/2023 Duration: 22.46 mins Patient history: 63yo M with transient confusion. EEG to evaluate for seizure Level of alertness: Awake AEDs during EEG study: None Technical aspects: This EEG study was done with scalp electrodes positioned according to the 10-20 International system of electrode placement. Electrical activity was reviewed with band pass filter of 1-70Hz , sensitivity of 7 uV/mm, display speed of 83mm/sec with a 60Hz  notched filter applied as appropriate. EEG data were recorded continuously and digitally stored.  Video monitoring was available and reviewed as appropriate. Description: The posterior dominant rhythm consists of 9-10 Hz activity of moderate voltage (25-35 uV) seen predominantly in posterior head regions, symmetric and reactive to eye opening and eye closing. Physiologic photic driving was not seen during photic stimulation. Hyperventilation was not performed.   IMPRESSION: This study is within normal limits. No seizures or epileptiform discharges were seen throughout the recording. A normal interictal EEG does not exclude the diagnosis of epilepsy. Priyanka KIDD Shelton   US  Carotid Bilateral Result Date: 12/29/2023 CLINICAL DATA:  Mental status change, dysarthria, hypertension and hyperlipidemia. EXAM: BILATERAL CAROTID DUPLEX ULTRASOUND TECHNIQUE: Elnor scale imaging, color Doppler and duplex ultrasound were performed of bilateral carotid and vertebral arteries in the neck. COMPARISON:  None Available. FINDINGS: Criteria:  Quantification of carotid stenosis is based on velocity parameters that correlate the residual  internal carotid diameter with NASCET-based stenosis levels, using the diameter of the distal internal carotid lumen as the denominator for stenosis measurement. The following velocity measurements were obtained: RIGHT ICA:  74/27 cm/sec CCA:  125/21 cm/sec SYSTOLIC ICA/CCA RATIO:  0.6 ECA:  77 cm/sec LEFT ICA:  69/26 cm/sec CCA:  103/21 cm/sec SYSTOLIC ICA/CCA RATIO:  0.7 ECA:  84 cm/sec RIGHT CAROTID ARTERY: No focal plaque or evidence of carotid stenosis in the neck. The right ICA is mildly tortuous. RIGHT VERTEBRAL ARTERY: Antegrade flow with normal waveform and velocity. LEFT CAROTID ARTERY: No focal plaque or evidence carotid stenosis in the neck. LEFT VERTEBRAL ARTERY: Antegrade flow with normal waveform and velocity. IMPRESSION: No evidence of carotid stenosis in the neck. Electronically Signed   By: Marcey Moan M.D.   On: 12/29/2023 09:38   US  Venous Img Lower Bilateral (DVT) Result Date: 12/29/2023 CLINICAL DATA:  Right calf pain and edema for 1 week. EXAM: BILATERAL LOWER EXTREMITY VENOUS DOPPLER ULTRASOUND TECHNIQUE: Gray-scale sonography with graded compression, as well as color Doppler and duplex ultrasound were performed to evaluate the lower extremity deep venous systems from the level of the common femoral vein and including the common femoral, femoral, profunda femoral, popliteal and calf veins including the posterior tibial, peroneal and gastrocnemius veins when visible. The superficial great saphenous vein was also interrogated. Spectral Doppler was utilized to evaluate flow at rest and with distal augmentation maneuvers in the common femoral, femoral and popliteal veins. COMPARISON:  None Available. FINDINGS: RIGHT LOWER EXTREMITY Common Femoral Vein: No evidence of thrombus. Normal compressibility, respiratory phasicity and response to augmentation. Saphenofemoral Junction: No evidence of thrombus. Normal compressibility and flow on color Doppler imaging. Profunda Femoral Vein: No  evidence of thrombus. Normal compressibility and flow on color Doppler imaging. Femoral Vein: No evidence of thrombus. Normal compressibility, respiratory phasicity and response to augmentation. Popliteal Vein: No evidence of thrombus. Normal compressibility, respiratory phasicity and response to augmentation. Calf Veins: Isolated DVT in the right peroneal vein. Posterior tibial and anterior tibial veins demonstrate normal patency. Superficial Great Saphenous Vein: No evidence of thrombus. Normal compressibility. Venous Reflux:  None. Other Findings: No evidence of superficial thrombophlebitis or abnormal fluid collection. LEFT LOWER EXTREMITY Common Femoral Vein: No evidence of thrombus. Normal compressibility, respiratory phasicity and response to augmentation. Saphenofemoral Junction: No evidence of thrombus. Normal compressibility and flow on color Doppler imaging. Profunda Femoral Vein: No evidence of thrombus. Normal compressibility and flow on color Doppler imaging. Femoral Vein: No evidence of thrombus. Normal compressibility, respiratory phasicity and response to augmentation. Popliteal Vein: No evidence of thrombus. Normal compressibility, respiratory phasicity and response to augmentation. Calf Veins: No evidence of thrombus. Normal compressibility and flow on color Doppler imaging. Superficial Great Saphenous Vein: No evidence of thrombus. Normal compressibility. Venous Reflux:  None. Other Findings: No evidence of superficial thrombophlebitis or abnormal fluid collection. IMPRESSION: 1. Isolated DVT in the right peroneal vein. 2. No evidence of left lower extremity DVT. Electronically Signed   By: Marcey Moan M.D.   On: 12/29/2023 09:19   CT HEAD WO CONTRAST ( ) Result Date: 12/29/2023 CLINICAL DATA:  Altered mental status EXAM: CT HEAD WITHOUT CONTRAST TECHNIQUE: Contiguous axial images were obtained from the base of the skull through the vertex without intravenous contrast. RADIATION DOSE  REDUCTION: This exam was performed according to the departmental dose-optimization program which includes automated exposure control, adjustment of the mA and/or kV  according to patient size and/or use of iterative reconstruction technique. COMPARISON:  None Available. FINDINGS: Brain: No acute intracranial abnormality. Specifically, no hemorrhage, hydrocephalus, mass lesion, acute infarction, or significant intracranial injury. Vascular: No hyperdense vessel or unexpected calcification. Skull: No acute calvarial abnormality. Sinuses/Orbits: No acute findings Other: None IMPRESSION: Normal study. Electronically Signed   By: Franky Crease M.D.   On: 12/29/2023 01:19   CT Angio Chest Pulmonary Embolism (PE) W or WO Contrast Result Date: 12/29/2023 CLINICAL DATA:  Abdominal and flank pain with stone suspected. Left-sided chest pain and left-sided flank pain starting 3 hours ago. Pulmonary embolus suspected with high probability. EXAM: CT ANGIOGRAPHY CHEST CT ABDOMEN AND PELVIS WITH CONTRAST TECHNIQUE: Multidetector CT imaging of the chest was performed using the standard protocol during bolus administration of intravenous contrast. Multiplanar CT image reconstructions and MIPs were obtained to evaluate the vascular anatomy. Multidetector CT imaging of the abdomen and pelvis was performed using the standard protocol during bolus administration of intravenous contrast. RADIATION DOSE REDUCTION: This exam was performed according to the departmental dose-optimization program which includes automated exposure control, adjustment of the mA and/or kV according to patient size and/or use of iterative reconstruction technique. CONTRAST:  75mL OMNIPAQUE  IOHEXOL  350 MG/ML SOLN COMPARISON:  CT abdomen pelvis 11/27/2022 FINDINGS: CTA CHEST FINDINGS Cardiovascular: Technically adequate study with good opacification of the central and segmental pulmonary arteries. Moderate motion artifact. Filling defects in multiple bilateral  segmental and subsegmental pulmonary arteries in the upper and lower lungs consistent with acute pulmonary embolus. Normal heart size. No pericardial effusions. RA to LV ratio measures 0.7 suggesting no evidence of right heart strain. Normal caliber thoracic aorta. No aortic dissection. Great vessel origins are patent. Calcification in the coronary arteries. Mediastinum/Nodes: No enlarged mediastinal, hilar, or axillary lymph nodes. Thyroid gland, trachea, and esophagus demonstrate no significant findings. Lungs/Pleura: Motion artifact. Atelectasis or infiltration in both lung bases. No pleural effusion or pneumothorax. Musculoskeletal: No chest wall abnormality. No acute or significant osseous findings. Review of the MIP images confirms the above findings. CT ABDOMEN and PELVIS FINDINGS Hepatobiliary: Diffuse fatty infiltration of the liver. No focal lesions. Gallbladder and bile ducts are normal. Pancreas: Unremarkable. No pancreatic ductal dilatation or surrounding inflammatory changes. Spleen: Normal in size without focal abnormality. Adrenals/Urinary Tract: No adrenal gland nodules. Right renal cyst measuring 5.7 cm diameter. No change since prior study. No imaging follow-up is indicated. No hydronephrosis or hydroureter. Bladder wall is thickened. This may be due to under distention or cystitis. Correlate with urinalysis. Stomach/Bowel: Stomach is within normal limits. Appendix appears normal. No evidence of bowel wall thickening, distention, or inflammatory changes. Vascular/Lymphatic: No significant vascular findings are present. No enlarged abdominal or pelvic lymph nodes. Reproductive: Prostate is unremarkable. Other: No abdominal wall hernia or abnormality. No abdominopelvic ascites. Musculoskeletal: Degenerative changes in the spine. No acute bony abnormalities. Review of the MIP images confirms the above findings. IMPRESSION: 1. Positive examination for multiple bilateral pulmonary emboli. No evidence  of right heart strain. 2. Infiltration or atelectasis in both lung bases. 3. Fatty infiltration of the liver. 4. Bladder wall thickening may be due to under distention or cystitis. Correlate with urinalysis. 5. No bowel obstruction or inflammation. Critical Value/emergent results were called by telephone at the time of interpretation on 12/29/2023 at 1:14 am to provider [facility] , who verbally acknowledged these results. Electronically Signed   By: Elsie Gravely M.D.   On: 12/29/2023 01:17   CT ABDOMEN PELVIS W CONTRAST Result Date: 12/29/2023  CLINICAL DATA:  Abdominal and flank pain with stone suspected. Left-sided chest pain and left-sided flank pain starting 3 hours ago. Pulmonary embolus suspected with high probability. EXAM: CT ANGIOGRAPHY CHEST CT ABDOMEN AND PELVIS WITH CONTRAST TECHNIQUE: Multidetector CT imaging of the chest was performed using the standard protocol during bolus administration of intravenous contrast. Multiplanar CT image reconstructions and MIPs were obtained to evaluate the vascular anatomy. Multidetector CT imaging of the abdomen and pelvis was performed using the standard protocol during bolus administration of intravenous contrast. RADIATION DOSE REDUCTION: This exam was performed according to the departmental dose-optimization program which includes automated exposure control, adjustment of the mA and/or kV according to patient size and/or use of iterative reconstruction technique. CONTRAST:  75mL OMNIPAQUE  IOHEXOL  350 MG/ML SOLN COMPARISON:  CT abdomen pelvis 11/27/2022 FINDINGS: CTA CHEST FINDINGS Cardiovascular: Technically adequate study with good opacification of the central and segmental pulmonary arteries. Moderate motion artifact. Filling defects in multiple bilateral segmental and subsegmental pulmonary arteries in the upper and lower lungs consistent with acute pulmonary embolus. Normal heart size. No pericardial effusions. RA to LV ratio measures 0.7 suggesting no  evidence of right heart strain. Normal caliber thoracic aorta. No aortic dissection. Great vessel origins are patent. Calcification in the coronary arteries. Mediastinum/Nodes: No enlarged mediastinal, hilar, or axillary lymph nodes. Thyroid gland, trachea, and esophagus demonstrate no significant findings. Lungs/Pleura: Motion artifact. Atelectasis or infiltration in both lung bases. No pleural effusion or pneumothorax. Musculoskeletal: No chest wall abnormality. No acute or significant osseous findings. Review of the MIP images confirms the above findings. CT ABDOMEN and PELVIS FINDINGS Hepatobiliary: Diffuse fatty infiltration of the liver. No focal lesions. Gallbladder and bile ducts are normal. Pancreas: Unremarkable. No pancreatic ductal dilatation or surrounding inflammatory changes. Spleen: Normal in size without focal abnormality. Adrenals/Urinary Tract: No adrenal gland nodules. Right renal cyst measuring 5.7 cm diameter. No change since prior study. No imaging follow-up is indicated. No hydronephrosis or hydroureter. Bladder wall is thickened. This may be due to under distention or cystitis. Correlate with urinalysis. Stomach/Bowel: Stomach is within normal limits. Appendix appears normal. No evidence of bowel wall thickening, distention, or inflammatory changes. Vascular/Lymphatic: No significant vascular findings are present. No enlarged abdominal or pelvic lymph nodes. Reproductive: Prostate is unremarkable. Other: No abdominal wall hernia or abnormality. No abdominopelvic ascites. Musculoskeletal: Degenerative changes in the spine. No acute bony abnormalities. Review of the MIP images confirms the above findings. IMPRESSION: 1. Positive examination for multiple bilateral pulmonary emboli. No evidence of right heart strain. 2. Infiltration or atelectasis in both lung bases. 3. Fatty infiltration of the liver. 4. Bladder wall thickening may be due to under distention or cystitis. Correlate with  urinalysis. 5. No bowel obstruction or inflammation. Critical Value/emergent results were called by telephone at the time of interpretation on 12/29/2023 at 1:14 am to provider [facility] , who verbally acknowledged these results. Electronically Signed   By: Elsie Gravely M.D.   On: 12/29/2023 01:17   DG Chest 2 View Result Date: 12/28/2023 CLINICAL DATA:  Chest pain EXAM: CHEST - 2 VIEW COMPARISON:  11/27/2022 FINDINGS: No pleural effusion or pneumothorax. Streaky atelectasis or minimal infiltrates at the bases. Normal cardiac size. No pneumothorax IMPRESSION: Streaky atelectasis or minimal infiltrates at the bases. Electronically Signed   By: Luke Bun M.D.   On: 12/28/2023 22:31    Microbiology: Results for orders placed or performed during the hospital encounter of 07/09/22  Resp panel by RT-PCR (RSV, Flu A&B, Covid) Anterior Nasal Swab  Status: Abnormal   Collection Time: 07/09/22  1:00 AM   Specimen: Anterior Nasal Swab  Result Value Ref Range Status   SARS Coronavirus 2 by RT PCR NEGATIVE NEGATIVE Final    Comment: (NOTE) SARS-CoV-2 target nucleic acids are NOT DETECTED.  The SARS-CoV-2 RNA is generally detectable in upper respiratory specimens during the acute phase of infection. The lowest concentration of SARS-CoV-2 viral copies this assay can detect is 138 copies/mL. A negative result does not preclude SARS-Cov-2 infection and should not be used as the sole basis for treatment or other patient management decisions. A negative result may occur with  improper specimen collection/handling, submission of specimen other than nasopharyngeal swab, presence of viral mutation(s) within the areas targeted by this assay, and inadequate number of viral copies(<138 copies/mL). A negative result must be combined with clinical observations, patient history, and epidemiological information. The expected result is Negative.  Fact Sheet for Patients:   BloggerCourse.com  Fact Sheet for Healthcare Providers:  SeriousBroker.it  This test is no t yet approved or cleared by the [place]  FDA and  has been authorized for detection and/or diagnosis of SARS-CoV-2 by FDA under an Emergency Use Authorization (EUA). This EUA will remain  in effect (meaning this test can be used) for the duration of the COVID-19 declaration under Section 564(b)(1) of the Act, 21 U.S.C.section 360bbb-3(b)(1), unless the authorization is terminated  or revoked sooner.       Influenza A by PCR POSITIVE (A) NEGATIVE Final   Influenza B by PCR NEGATIVE NEGATIVE Final    Comment: (NOTE) The Xpert Xpress SARS-CoV-2/FLU/RSV plus assay is intended as an aid in the diagnosis of influenza from Nasopharyngeal swab specimens and should not be used as a sole basis for treatment. Nasal washings and aspirates are unacceptable for Xpert Xpress SARS-CoV-2/FLU/RSV testing.  Fact Sheet for Patients: BloggerCourse.com  Fact Sheet for Healthcare Providers: SeriousBroker.it  This test is not yet approved or cleared by the [place]  FDA and has been authorized for detection and/or diagnosis of SARS-CoV-2 by FDA under an Emergency Use Authorization (EUA). This EUA will remain in effect (meaning this test can be used) for the duration of the COVID-19 declaration under Section 564(b)(1) of the Act, 21 U.S.C. section 360bbb-3(b)(1), unless the authorization is terminated or revoked.     Resp Syncytial Virus by PCR NEGATIVE NEGATIVE Final    Comment: (NOTE) Fact Sheet for Patients: BloggerCourse.com  Fact Sheet for Healthcare Providers: SeriousBroker.it  This test is not yet approved or cleared by the [place]  FDA and has been authorized for detection and/or diagnosis of SARS-CoV-2 by FDA under an Emergency Use  Authorization (EUA). This EUA will remain in effect (meaning this test can be used) for the duration of the COVID-19 declaration under Section 564(b)(1) of the Act, 21 U.S.C. section 360bbb-3(b)(1), unless the authorization is terminated or revoked.  Performed at [facility], [address], [place], [place] [address]     Labs: CBC: Recent Labs  Lab 12/28/23 2252 12/29/23 0628 12/30/23 0351 12/31/23 0303  WBC 6.1 7.3 7.1 5.2  HGB 13.4 13.0 13.0 12.7*  HCT 41.0 43.1 41.0 39.1  MCV 80.4 85.5 79.9* 79.8*  PLT 169 165 170 183   Basic Metabolic Panel: Recent Labs  Lab 12/28/23 2252 12/29/23 0628 12/30/23 0351  NA 138 135 135  K 3.7 3.9 3.9  CL 100 101 102  CO2 25 25 24   GLUCOSE 142* 99 112*  BUN 17 17 17   CREATININE 1.70* 1.53*  1.66*  CALCIUM 9.5 9.1 9.0  MG  --  2.3 2.1  PHOS  --  3.5 2.6   Liver Function Tests: Recent Labs  Lab 12/29/23 0005 12/29/23 0628  AST 18 15  ALT 14 13  ALKPHOS 52 48  BILITOT 0.8 1.0  PROT 7.4 7.0  ALBUMIN 3.8 3.5   CBG: No results for input(s): GLUCAP in the last 168 hours.  Discharge time spent: greater than 30 minutes.  Signed: Alm Schneider, MD [facility] 12/31/2023

## 2024-01-01 LAB — LUPUS ANTICOAGULANT
DRVVT: 60.6 s — ABNORMAL HIGH (ref 0.0–47.0)
PTT Lupus Anticoagulant: 50.4 s — ABNORMAL HIGH (ref 0.0–43.5)
Thrombin Time: 42.2 s — ABNORMAL HIGH (ref 0.0–23.0)
dPT Confirm Ratio: 1.11 ratio (ref 0.00–1.34)
dPT: 55.2 s — ABNORMAL HIGH (ref 0.0–47.6)

## 2024-01-01 LAB — HEXAGONAL PHASE PHOSPHOLIPID: Hexagonal Phase Phospholipid: 5 s (ref 0–11)

## 2024-01-01 LAB — HEMOGLOBIN A1C
Hgb A1c MFr Bld: 5.9 % — ABNORMAL HIGH (ref 4.8–5.6)
Mean Plasma Glucose: 123 mg/dL

## 2024-01-01 LAB — DRVVT MIX: dRVVT Mix: 48 s — ABNORMAL HIGH (ref 0.0–40.4)

## 2024-01-01 LAB — DRVVT CONFIRM: dRVVT Confirm: 1.2 ratio (ref 0.8–1.2)

## 2024-01-01 LAB — TT MIX+TTN
THROMBIN NEUTRALIZATION: 22.7 s (ref 0.0–23.0)
THROMBIN TIME MIX: 31.1 s — ABNORMAL HIGH (ref 0.0–23.0)

## 2024-01-01 LAB — PTT-LA MIX: PTT-LA Mix: 48.3 s — ABNORMAL HIGH (ref 0.0–40.5)

## 2024-01-03 LAB — FACTOR 5 LEIDEN

## 2024-01-03 LAB — PROTHROMBIN GENE MUTATION

## 2024-03-28 ENCOUNTER — Other Ambulatory Visit (HOSPITAL_COMMUNITY): Payer: Self-pay | Admitting: Nephrology

## 2024-03-28 DIAGNOSIS — N1831 Chronic kidney disease, stage 3a: Secondary | ICD-10-CM

## 2024-03-28 DIAGNOSIS — D638 Anemia in other chronic diseases classified elsewhere: Secondary | ICD-10-CM

## 2024-04-03 ENCOUNTER — Ambulatory Visit (HOSPITAL_COMMUNITY)
Admission: RE | Admit: 2024-04-03 | Discharge: 2024-04-03 | Disposition: A | Discharge status: Home / Self Care | Source: Ambulatory Visit | Attending: Nephrology | Admitting: Nephrology

## 2024-04-03 DIAGNOSIS — N1831 Chronic kidney disease, stage 3a: Secondary | ICD-10-CM | POA: Insufficient documentation

## 2024-04-03 DIAGNOSIS — D638 Anemia in other chronic diseases classified elsewhere: Secondary | ICD-10-CM | POA: Insufficient documentation

## 2024-08-01 ENCOUNTER — Ambulatory Visit: Admitting: General Surgery
